# Patient Record
Sex: Male | Born: 1955 | Race: White | Hispanic: No | Marital: Married | Smoking: Former smoker
Health system: Southern US, Community
[De-identification: ages and names within clinical notes are randomized; demographics above are authoritative.]

## PROBLEM LIST (undated history)

## (undated) DIAGNOSIS — R112 Nausea with vomiting, unspecified: Secondary | ICD-10-CM

## (undated) DIAGNOSIS — M23359 Other meniscus derangements, posterior horn of lateral meniscus, unspecified knee: Secondary | ICD-10-CM

## (undated) DIAGNOSIS — K219 Gastro-esophageal reflux disease without esophagitis: Secondary | ICD-10-CM

## (undated) DIAGNOSIS — M1990 Unspecified osteoarthritis, unspecified site: Secondary | ICD-10-CM

## (undated) DIAGNOSIS — E782 Mixed hyperlipidemia: Secondary | ICD-10-CM

## (undated) DIAGNOSIS — R000 Tachycardia, unspecified: Secondary | ICD-10-CM

## (undated) DIAGNOSIS — S83519A Sprain of anterior cruciate ligament of unspecified knee, initial encounter: Secondary | ICD-10-CM

## (undated) DIAGNOSIS — R972 Elevated prostate specific antigen [PSA]: Secondary | ICD-10-CM

## (undated) DIAGNOSIS — E119 Type 2 diabetes mellitus without complications: Secondary | ICD-10-CM

## (undated) DIAGNOSIS — G4730 Sleep apnea, unspecified: Secondary | ICD-10-CM

## (undated) DIAGNOSIS — M23329 Other meniscus derangements, posterior horn of medial meniscus, unspecified knee: Secondary | ICD-10-CM

## (undated) DIAGNOSIS — Z9889 Other specified postprocedural states: Secondary | ICD-10-CM

## (undated) DIAGNOSIS — R079 Chest pain, unspecified: Secondary | ICD-10-CM

## (undated) DIAGNOSIS — I10 Essential (primary) hypertension: Secondary | ICD-10-CM

## (undated) HISTORY — DX: Tachycardia, unspecified: R00.0

## (undated) HISTORY — PX: NASAL SEPTUM SURGERY: SHX37

## (undated) HISTORY — DX: Other meniscus derangements, posterior horn of lateral meniscus, unspecified knee: M23.359

## (undated) HISTORY — PX: KNEE ARTHROSCOPY: SUR90

## (undated) HISTORY — DX: Other meniscus derangements, posterior horn of medial meniscus, unspecified knee: M23.329

## (undated) HISTORY — DX: Chest pain, unspecified: R07.9

## (undated) HISTORY — PX: VASECTOMY: SHX75

## (undated) HISTORY — DX: Unspecified osteoarthritis, unspecified site: M19.90

## (undated) HISTORY — DX: Essential (primary) hypertension: I10

## (undated) HISTORY — DX: Gastro-esophageal reflux disease without esophagitis: K21.9

## (undated) HISTORY — DX: Sprain of anterior cruciate ligament of unspecified knee, initial encounter: S83.519A

## (undated) HISTORY — DX: Mixed hyperlipidemia: E78.2

## (undated) HISTORY — PX: TONSILLECTOMY AND ADENOIDECTOMY: SUR1326

## (undated) HISTORY — DX: Elevated prostate specific antigen (PSA): R97.20

---

## 1999-03-01 ENCOUNTER — Encounter: Payer: Self-pay | Admitting: Emergency Medicine

## 1999-03-01 ENCOUNTER — Emergency Department (HOSPITAL_COMMUNITY): Admission: EM | Admit: 1999-03-01 | Discharge: 1999-03-01 | Payer: Self-pay | Admitting: Emergency Medicine

## 1999-03-05 ENCOUNTER — Emergency Department (HOSPITAL_COMMUNITY): Admission: EM | Admit: 1999-03-05 | Discharge: 1999-03-05 | Payer: Self-pay | Admitting: Emergency Medicine

## 1999-03-06 ENCOUNTER — Encounter: Payer: Self-pay | Admitting: Emergency Medicine

## 2003-04-21 ENCOUNTER — Emergency Department (HOSPITAL_COMMUNITY): Admission: EM | Admit: 2003-04-21 | Discharge: 2003-04-21 | Payer: Self-pay | Admitting: Emergency Medicine

## 2003-04-21 ENCOUNTER — Encounter: Payer: Self-pay | Admitting: Emergency Medicine

## 2006-03-15 ENCOUNTER — Ambulatory Visit: Payer: Self-pay | Admitting: *Deleted

## 2006-03-15 ENCOUNTER — Encounter: Payer: Self-pay | Admitting: Cardiology

## 2008-03-31 ENCOUNTER — Encounter: Admission: RE | Admit: 2008-03-31 | Discharge: 2008-03-31 | Payer: Self-pay | Admitting: Occupational Medicine

## 2008-09-22 ENCOUNTER — Encounter: Payer: Self-pay | Admitting: Orthopedic Surgery

## 2008-09-22 HISTORY — PX: PROSTATE BIOPSY: SHX241

## 2009-01-24 ENCOUNTER — Emergency Department (HOSPITAL_COMMUNITY): Admission: EM | Admit: 2009-01-24 | Discharge: 2009-01-25 | Payer: Self-pay | Admitting: Emergency Medicine

## 2009-01-27 ENCOUNTER — Ambulatory Visit: Payer: Self-pay | Admitting: Orthopedic Surgery

## 2009-01-27 ENCOUNTER — Encounter (INDEPENDENT_AMBULATORY_CARE_PROVIDER_SITE_OTHER): Payer: Self-pay | Admitting: *Deleted

## 2009-01-27 DIAGNOSIS — M23302 Other meniscus derangements, unspecified lateral meniscus, unspecified knee: Secondary | ICD-10-CM | POA: Insufficient documentation

## 2009-01-27 DIAGNOSIS — S83509A Sprain of unspecified cruciate ligament of unspecified knee, initial encounter: Secondary | ICD-10-CM | POA: Insufficient documentation

## 2009-02-01 ENCOUNTER — Telehealth: Payer: Self-pay | Admitting: Orthopedic Surgery

## 2009-02-09 ENCOUNTER — Telehealth: Payer: Self-pay | Admitting: Orthopedic Surgery

## 2009-02-14 ENCOUNTER — Encounter: Payer: Self-pay | Admitting: Orthopedic Surgery

## 2009-02-14 ENCOUNTER — Encounter (INDEPENDENT_AMBULATORY_CARE_PROVIDER_SITE_OTHER): Payer: Self-pay | Admitting: *Deleted

## 2009-02-14 ENCOUNTER — Telehealth: Payer: Self-pay | Admitting: Orthopedic Surgery

## 2009-02-16 ENCOUNTER — Ambulatory Visit (HOSPITAL_COMMUNITY): Admission: RE | Admit: 2009-02-16 | Discharge: 2009-02-16 | Payer: Self-pay | Admitting: Orthopedic Surgery

## 2009-02-16 ENCOUNTER — Telehealth: Payer: Self-pay | Admitting: Orthopedic Surgery

## 2009-02-23 ENCOUNTER — Encounter (INDEPENDENT_AMBULATORY_CARE_PROVIDER_SITE_OTHER): Payer: Self-pay | Admitting: *Deleted

## 2009-02-23 ENCOUNTER — Ambulatory Visit: Payer: Self-pay | Admitting: Orthopedic Surgery

## 2009-02-23 DIAGNOSIS — M23329 Other meniscus derangements, posterior horn of medial meniscus, unspecified knee: Secondary | ICD-10-CM | POA: Insufficient documentation

## 2009-03-01 ENCOUNTER — Ambulatory Visit (HOSPITAL_COMMUNITY): Admission: RE | Admit: 2009-03-01 | Discharge: 2009-03-01 | Payer: Self-pay | Admitting: Orthopedic Surgery

## 2009-03-01 ENCOUNTER — Ambulatory Visit: Payer: Self-pay | Admitting: Orthopedic Surgery

## 2009-03-01 DIAGNOSIS — E785 Hyperlipidemia, unspecified: Secondary | ICD-10-CM

## 2009-03-01 DIAGNOSIS — M1990 Unspecified osteoarthritis, unspecified site: Secondary | ICD-10-CM | POA: Insufficient documentation

## 2009-03-01 DIAGNOSIS — K219 Gastro-esophageal reflux disease without esophagitis: Secondary | ICD-10-CM

## 2009-03-01 DIAGNOSIS — R079 Chest pain, unspecified: Secondary | ICD-10-CM

## 2009-03-01 DIAGNOSIS — I10 Essential (primary) hypertension: Secondary | ICD-10-CM | POA: Insufficient documentation

## 2009-03-01 DIAGNOSIS — R0989 Other specified symptoms and signs involving the circulatory and respiratory systems: Secondary | ICD-10-CM | POA: Insufficient documentation

## 2009-03-03 ENCOUNTER — Ambulatory Visit: Payer: Self-pay | Admitting: Orthopedic Surgery

## 2009-03-04 ENCOUNTER — Encounter: Payer: Self-pay | Admitting: Orthopedic Surgery

## 2009-03-23 ENCOUNTER — Telehealth: Payer: Self-pay | Admitting: Orthopedic Surgery

## 2009-03-31 ENCOUNTER — Ambulatory Visit: Payer: Self-pay | Admitting: Orthopedic Surgery

## 2009-03-31 ENCOUNTER — Encounter (INDEPENDENT_AMBULATORY_CARE_PROVIDER_SITE_OTHER): Payer: Self-pay | Admitting: *Deleted

## 2009-05-06 ENCOUNTER — Encounter: Payer: Self-pay | Admitting: Family Medicine

## 2009-10-19 ENCOUNTER — Emergency Department (HOSPITAL_COMMUNITY): Admission: EM | Admit: 2009-10-19 | Discharge: 2009-10-19 | Payer: Self-pay | Admitting: Emergency Medicine

## 2010-06-01 ENCOUNTER — Emergency Department (HOSPITAL_COMMUNITY)
Admission: EM | Admit: 2010-06-01 | Discharge: 2010-06-01 | Payer: Self-pay | Source: Home / Self Care | Admitting: Family Medicine

## 2010-08-05 ENCOUNTER — Ambulatory Visit: Payer: Self-pay | Admitting: Cardiovascular Disease

## 2010-08-05 ENCOUNTER — Observation Stay (HOSPITAL_COMMUNITY)
Admission: EM | Admit: 2010-08-05 | Discharge: 2010-08-06 | Payer: Self-pay | Source: Home / Self Care | Admitting: Emergency Medicine

## 2010-08-11 ENCOUNTER — Ambulatory Visit (HOSPITAL_COMMUNITY)
Admission: RE | Admit: 2010-08-11 | Discharge: 2010-08-11 | Payer: Self-pay | Source: Home / Self Care | Attending: Cardiology | Admitting: Cardiology

## 2010-08-21 ENCOUNTER — Ambulatory Visit: Payer: Self-pay | Admitting: Internal Medicine

## 2010-08-21 DIAGNOSIS — R1319 Other dysphagia: Secondary | ICD-10-CM | POA: Insufficient documentation

## 2010-08-22 ENCOUNTER — Ambulatory Visit: Payer: Self-pay | Admitting: Cardiology

## 2010-08-29 ENCOUNTER — Ambulatory Visit (HOSPITAL_COMMUNITY)
Admission: RE | Admit: 2010-08-29 | Discharge: 2010-08-29 | Payer: Self-pay | Source: Home / Self Care | Attending: Internal Medicine | Admitting: Internal Medicine

## 2010-09-25 NOTE — Op Note (Addendum)
  [name] [name], CRISANTO                  ACCOUNT NO.:  [id number]  MEDICAL RECORD NO.:  [id number]          PATIENT TYPE:  AMB  LOCATION:  DAY                           FACILITY:  [facility]  PHYSICIAN:  R. Roetta Sessions, M.D. DATE OF BIRTH:  01/12/1956  DATE OF PROCEDURE:  08/29/2010 DATE OF DISCHARGE:                              OPERATIVE REPORT   PROCEDURE:  EGD with Elease Hashimoto dilation.  INDICATIONS FOR PROCEDURE:  A 54-year gentleman with insidiously worsening reflux symptoms and esophageal dysphagia in the setting of significant weight gain over the past 1 year.  We switched him from Nexium once daily to the course of Dexilant 60 mg orally daily.  The patient could not tell much difference in his symptoms.  EGD with possible dilation is appropriate, now being done.  Risks, benefits, limitations, alternatives, and imponderables have been discussed previously and again at the bedside all parties agreeable.  Please see the documentation of medical record.  PROCEDURE NOTE:  O2 saturation, blood pressure, pulse, respirations were monitored throughout the entirety of procedure.  CONSCIOUS SEDATION:  Versed 4 mg IV, Demerol 75 mg IV in divided doses. Cetacaine spray for topical pharyngeal anesthesia.  INSTRUMENT:  Pentax video chip system.  FINDINGS:  Examination of tubular esophagus revealed a couple of tiny erosions straddling GE junction.  There is no Barrett's esophagus or other abnormality.  The tubular esophagus appeared widely patent through the EG junction.  Stomach:  Gastric cavity was emptied and insufflated well with air. Thorough examination of gastric mucosa including retroflexion of proximal stomach, esophagogastric junction demonstrated only a small hiatal hernia.  Pylorus was patent, easily traversed.  Examination of bulb and second portion revealed no abnormalities.  THERAPEUTIC/DIAGNOSTIC MANEUVERS PERFORMED:  Scope was withdrawn.  A 56- French Maloney dilator was  passed to full insertion with ease.  A look back revealed no apparent complication related to passage of the dilator.  The patient tolerated the procedure well.  IMPRESSION: 1. Tiny distal esophageal erosions consistent with mild erosive reflux     esophagitis, otherwise unremarkable esophagus status post passage     of 56-French Maloney dilator. 2. Small hiatal hernia, otherwise normal stomach, D1, and D2.  RECOMMENDATIONS: 1. Increase Nexium 40 mg orally twice daily i.e. before breakfast and     supper. 2. Antireflux literature provided to Mr. [name]. 3. Weight loss emphasized, __________ b.i.d. Nexium regimen for next 3     months, and then can drop back to once daily. 4. Follow up appointment with [place] in 6 months to assess his progress.     Jonathon Bellows, M.D.     RMR/MEDQ  D:  08/29/2010  T:  08/29/2010  Job:  [id number]  cc:   [facility]  Electronically Signed by Lorrin Goodell M.D. on 09/25/2010 09:11:42 AM

## 2010-10-05 NOTE — Assessment & Plan Note (Signed)
Summary: EPH CP POST STRESS ECHO   Visit Type:  Follow-up Primary [name] [name]:  [facility]   History of Present Illness:  [name] is a pleasant 55 y/o obese CM who we are seeing post hospitalization and OP stress test in the setting of chest pain.  During hospitalization Dec 03-04, 2011 he was ruled out for CAD.  F/U OP testing was scheduled.  He is here to discuss the results.  He states that he continues to have occaisonal discomfort in his chest but not severe as it was during his admission to the hospital.  He concern for CAD was related to his younger sister who has had a recent CABG. His other history includes hypertension, dyslipidemia, and tobacco abuse.    Current Medications (verified): 1)  Diovan Hct 320/25 Mg Tabs (Valsartan-Hydrochlorothiazide) .... Take 1 Tablet By Mouth Once A Day 2)  Carvedilol 6.25 Mg Tabs (Carvedilol) .... Take 1 Tablet By Mouth Two Times A Day 3)  Pravastatin Sodium 40 Mg Tabs (Pravastatin Sodium) .... Take 1 Tablet By Mouth Once A Day 4)  Nexium 40 Mg Cpdr (Esomeprazole Magnesium) .... Take 1 Tablet By Mouth Once A Day 5)  Aspir-Low 81 Mg Tbec (Aspirin) .... Take 1 Tablet By Mouth Once A Day  Allergies: No Known Drug Allergies  Comments:  Nurse/Medical Assistant: patient brought med list which is on his phone patients pharmacy is [facility] PMH-FH-SH reviewed-no changes except otherwise noted  Review of Systems       occasional.  All other systems have been reviewed and are negative unless stated above.   Vital Signs:  Patient profile:   55 year old male Weight:      232 pounds BMI:     35.40 Pulse rate:   75 / minute BP sitting:   130 / 81  (right arm)  Vitals Entered By: Dreama Saa, CNA (August 22, 2010 12:50 PM)  Physical Exam  General:  Well developed, well nourished, in no acute distress. Lungs:  Clear bilaterally to auscultation and percussion. Heart:  Non-displaced PMI, chest non-tender; regular rate and  rhythm, S1, S2 without murmurs, rubs or gallops. Carotid upstroke normal, no bruit. Normal abdominal aortic size, no bruits. Femorals normal pulses, no bruits. Pedals normal pulses. No edema, no varicosities. Abdomen:  Bowel sounds positive; abdomen soft and non-tender without masses, organomegaly, or hernias noted. No hepatosplenomegaly. Msk:  Back normal, normal gait. Muscle strength and tone normal. Pulses:  pulses normal in all 4 extremities Psych:  Normal affect.   Impression & Recommendations:  Problem # 1:  CHEST PAIN-UNSPECIFIED (ICD-786.50) Review of stress/echo  dated 08/11/2010 demonstrates "Normal stress echo with no evidence of ischemia."  Reassurance is given.  He states that he is scheduled for EGD next week.  He is advised to come back on as needed basis. His updated medication list for this problem includes:    Aspir-low 81 Mg Tbec (Aspirin) .Marland Kitchen... Take 1 tablet by mouth once a day  Problem # 2:  HYPERTENSION, UNSPECIFIED (ICD-401.9) Well controlled. His updated medication list for this problem includes:    Aspir-low 81 Mg Tbec (Aspirin) .Marland Kitchen... Take 1 tablet by mouth once a day  Patient Instructions: 1)  Your physician recommends that you schedule a follow-up appointment in: as needed  2)  Your physician recommends that you continue on your current medications as directed. Please refer to the Current Medication list given to you today.

## 2010-10-05 NOTE — Letter (Signed)
Summary: EGD ORDER  EGD ORDER   Imported By: Ave Filter 08/21/2010 15:17:52  _____________________________________________________________________  External Attachment:    Type:   Image     Comment:   External Document

## 2010-10-05 NOTE — Assessment & Plan Note (Signed)
Summary: chest pains,acid reflux,gerd/ss   Visit Type:  Initial Visit Primary Care Provider:  [facility]  CC:  chest pains, acid reflux, and gerd.  History of Present Illness: [name] [name] is a 55 year old male with hx of longstanding  reflux, episodes of intermittent nocturnal reflux. Has been on a PPI for at least 10 years. States noticed aggravating symptoms over past few months, including the nocturnal symptoms. Worsening break-through reflux. C/o dysgphia, worsening over past few months. No odynophagia. No epigastric pain. Occasional nausea. Takes Nexium 40 mg, at least for a few years. Has tried Prevacid in past as well. Attempts a healthy diet, reflux not necessarily r/t spicy foods. tries to avoid eating for at least 4 hours prior to sleep. +wt gain since spring, approximately 35 lbs. Has never had an upper endscopy.  Current Medications (verified): 1)  Diovan Hct 320/25 Mg Tabs (Valsartan-Hydrochlorothiazide) .... Take 1 Tablet By Mouth Once A Day 2)  Carvedilol 6.25 Mg Tabs (Carvedilol) .... Take 1 Tablet By Mouth Two Times A Day 3)  Pravastatin Sodium 40 Mg Tabs (Pravastatin Sodium) .... Take 1 Tablet By Mouth Once A Day 4)  Nexium 40 Mg Cpdr (Esomeprazole Magnesium) .... Take 1 Tablet By Mouth Once A Day 5)  Aspir-Low 81 Mg Tbec (Aspirin) .... Take 1 Tablet By Mouth Once A Day  Allergies (verified): No Known Drug Allergies  Past History:  Past Medical History: TACHYCARDIA (ICD-785) ACID REFLUX DISEASE (ICD-530.81) CHEST PAIN-UNSPECIFIED (ICD-786.50) DEGENERATIVE JOINT DISEASE (ICD-715.90) HYPERTENSION, UNSPECIFIED (ICD-401.9) HYPERLIPIDEMIA-MIXED (ICD-272.4) DERANGEMENT OF POSTERIOR HORN OF MEDIAL MENISCUS (ICD-717.2) TEAR A C L (ICD-844.2) DERANGEMENT MENISCUS (ICD-717.5) elevated PSA requiring several biopsies   Past Surgical History: tonsils adenoidectomy deviated septum bilateral knee arthrosscopy  biopsy of prostate 09/22/08 X 3  Family History: Mother:  diabetes, living Father:heart disease, living No FH of Colon Cancer:  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 Eyes:  Denies blurring, irritation, and discharge. ENT:  Complains of difficulty swallowing; denies sore throat and hoarseness. CV:  Denies chest pains, syncope, and dyspnea on exertion. Resp:  Denies dyspnea at rest and wheezing. GI:  Complains of nausea and indigestion/heartburn; denies difficulty swallowing, pain on swallowing, abdominal pain, constipation, change in bowel habits, bloody BM's, and black BMs. GU:  Denies urinary burning, blood in urine, and urinary frequency. MS:  Denies joint pain / LOM, joint swelling, and joint stiffness. Derm:  Denies rash, itching, and dry skin. Neuro:  Denies weakness and syncope. Psych:  Denies depression and anxiety. Endo:  Denies cold intolerance and heat intolerance. Heme:  Denies bruising and bleeding.  Vital Signs:  Patient profile:   55 year old male Height:      68 inches Weight:      237 pounds BMI:     36.17 Temp:     98.8 degrees F oral Pulse rate:   72 / minute BP sitting:   128 / 72  (left arm) Cuff size:   large  Vitals Entered By: Cloria Spring LPN (August 21, 2010 2:22 PM)  Physical Exam  General:  Well developed, well nourished, no acute distress.obese.   Head:  Normocephalic and atraumatic. Eyes:  sclera without icterus Mouth:  No deformity or lesions, dentition normal. Neck:  Supple; no masses or thyromegaly. Lungs:  Clear throughout to auscultation. Heart:  Regular rate and rhythm; no murmurs, rubs,  or bruits.  Abdomen:  normal bowel sounds, obese, without guarding, without rebound, no distesion, no tenderness, no masses, and no hepatomegally or splenomegaly.   Msk:  Symmetrical with no gross deformities. Normal posture. Extremities:  No  clubbing, cyanosis, edema or deformities noted. Neurologic:  Alert and  oriented x4;  grossly normal neurologically. Skin:  Intact without significant lesions or rashes. Psych:  Alert and cooperative. Normal mood and affect.  Impression & Recommendations:  Problem # 1:  GERD (ICD-43.36)  55 year old Caucasian male with long-standing hx of GERD, at least 10 years. No prior upper endoscopy. Hx of Prevacid, switched to Nexium. Has been on Nexium for several years. Has noticed exacerbation of reflux over past few months, including intermittent severe nocturnal reflux. +dysphagia over past few months. Has gained 30+ lbs since spring due to outside stressors. LIkely component of worsening GERD. Due to age, long-standing hx of GERD, needs upper endoscopy to evaluate for any evidence of Barrett's, as well as onset of dysphagia.   EGD/ED with RMR; risks, benefits, alternatives discussed. pt willing to proceed and has given verbal consent. Switch to Dexilant 60 mg daily. #15 samples given. Will send to pharmacy rx Appropriate food choices, weight loss goals discussed.  Orders: Consultation Level III (16109)  Problem # 2:  DYSPHAGIA (UEA-540.98)  See # 1  Orders: Consultation Level III (11914) Prescriptions: DEXILANT 60 MG CPDR (DEXLANSOPRAZOLE) take 1 by mouth daily  #30 x 3   Entered and Authorized by:   Gerrit Halls NP   Signed by:   Gerrit Halls NP on 08/22/2010   Method used:   Faxed to ...       [facility] (retail)       [address]       [place], [place]  [address]       Ph: [phone]       Fax: [phone]   RxID:   [id number]

## 2010-11-14 LAB — BASIC METABOLIC PANEL
BUN: 10 mg/dL (ref 6–23)
GFR calc non Af Amer: >60 mL/min (ref >60)
Glucose, Bld: 109 mg/dL — ABNORMAL HIGH (ref 70–99)
Potassium: 3.8 mEq/L (ref 3.5–5.1)

## 2010-11-14 LAB — DIFFERENTIAL
Basophils Absolute: 0 10*3/uL (ref 0.0–0.1)
Basophils Relative: 1 % (ref 0–1)
Eosinophils Absolute: 0.1 10*3/uL (ref 0.0–0.7)
Eosinophils Relative: 2 % (ref 0–5)
Monocytes Absolute: 0.6 10*3/uL (ref 0.1–1.0)
Neutro Abs: 4.1 10*3/uL (ref 1.7–7.7)

## 2010-11-14 LAB — CBC
HCT: 38.5 % — ABNORMAL LOW (ref 39.0–52.0)
HCT: 40.7 % (ref 39.0–52.0)
MCH: 30.1 pg (ref 26.0–34.0)
MCHC: 35.1 g/dL (ref 30.0–36.0)
MCV: 85.9 fL (ref 78.0–100.0)
Platelets: 180 10*3/uL (ref 150–400)
RDW: 12.3 % (ref 11.5–15.5)
RDW: 12.4 % (ref 11.5–15.5)

## 2010-11-14 LAB — CK TOTAL AND CKMB (NOT AT ARMC)
CK, MB: 1.5 ng/mL (ref 0.3–4.0)
Relative Index: 1.2 (ref 0.0–2.5)
Total CK: 128 U/L (ref 7–232)

## 2010-11-14 LAB — CARDIAC PANEL(CRET KIN+CKTOT+MB+TROPI)
CK, MB: 1.4 ng/mL (ref 0.3–4.0)
Relative Index: 1.2 (ref 0.0–2.5)
Relative Index: INVALID (ref 0.0–2.5)
Troponin I: 0.01 ng/mL (ref 0.00–0.06)

## 2010-11-14 LAB — TROPONIN I: Troponin I: <0.01 ng/mL (ref 0.00–0.06)

## 2010-11-14 LAB — POCT CARDIAC MARKERS

## 2010-11-14 LAB — D-DIMER, QUANTITATIVE: D-Dimer, Quant: 0.23 ug/mL-FEU (ref 0.00–0.48)

## 2010-11-22 LAB — POCT I-STAT, CHEM 8
Calcium, Ion: 1.12 mmol/L (ref 1.12–1.32)
Chloride: 106 mEq/L (ref 96–112)
Creatinine, Ser: 0.9 mg/dL (ref 0.4–1.5)
Glucose, Bld: 90 mg/dL (ref 70–99)
HCT: 36 % — ABNORMAL LOW (ref 39.0–52.0)
Hemoglobin: 12.2 g/dL — ABNORMAL LOW (ref 13.0–17.0)

## 2010-11-22 LAB — POCT CARDIAC MARKERS: Troponin i, poc: <0.05 ng/mL (ref 0.00–0.09)

## 2010-12-11 LAB — BASIC METABOLIC PANEL
BUN: 15 mg/dL (ref 6–23)
Chloride: 101 mEq/L (ref 96–112)
GFR calc non Af Amer: >60 mL/min (ref >60)
Potassium: 4.1 mEq/L (ref 3.5–5.1)
Sodium: 138 mEq/L (ref 135–145)

## 2010-12-11 LAB — HEMOGLOBIN AND HEMATOCRIT, BLOOD: Hemoglobin: 14.1 g/dL (ref 13.0–17.0)

## 2011-01-16 NOTE — Op Note (Signed)
NAMEKIZER, NOBBE NO.:  [id number]   MEDICAL RECORD NO.:  [id number]          PATIENT TYPE:  AMB   LOCATION:  DAY                           FACILITY:  [facility]   PHYSICIAN:  Vickki Hearing, M.D.DATE OF BIRTH:  [birth year]   DATE OF PROCEDURE:  03/01/2009  DATE OF DISCHARGE:  03/01/2009                               OPERATIVE REPORT   CHIEF COMPLAINT:  Right knee pain.   HISTORY:  This 55 years old, he was injured on Jan 24, 2009.  He stepped  off a step.  His knee popped.  He felt acute pain.  He eventually went  to the emergency room, where x-ray showed joint effusion, no fracture.  He was placed in a long-leg brace.  He was followed up in our office and  he was found to have signs of meniscal tear.  He was sent for MRI.  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   PREOPERATIVE DIAGNOSIS:  Torn medial meniscus, right knee.   POSTOPERATIVE DIAGNOSIS:  Torn medial meniscus, right knee.   PROCEDURE:  Arthroscopy right knee, partial medial meniscectomy.   SURGEON:  Vickki Hearing, MD   ASSISTANTS:  There were no assistants.   ANESTHESIA:  General anesthetic.   FINDINGS:  Tear of the posterior horn of the medial meniscus.  Remaining  portion of the joint were completely normal.  There was no  chondromalacia in the trochlea.   BLOOD LOSS:  Minimal.   COMPLICATIONS:  None.   COUNTS:  Reported as correct.   DETAILS OF PROCEDURE:  The patient was identified in the preop holding  area.  Site marking was performed, countersigned by the surgeon.  The  patient was taken to surgery after history and physical update and he  was given general anesthesia by LMA.  He had his right knee prepped with  chlorhexidine.  It was placed in a well leg holder prior to that.   Left leg was placed on a padded bolster.   After  time-out was completed, diagnostic arthroscopy was performed.  Medial portal was established for working.  Medial meniscus was probed,  found to be torn.  It was resected with the duckbill forceps and a  motorized shaver was used to balance the meniscus.  Probe was reinserted  in the knee and the meniscus was stable with a stable rim.  The  remaining portions of the knee joint were normal.  The knee was  irrigated and closed with Steri-Strips, injected with additional 30 mL  of Marcaine with epinephrine.  Cryo cuff was applied with sterile  dressing.   The patient was extubated and taken to recovery room in stable  condition.   Postop plan is for full weightbearing.  Cryo cuff for 48 hours.   Discharge medications will include:  1. Norco 7.5/325 one q.4 p.r.n. for pain #91 refill.  2. Phenergan 25 mg 1 every 4 hours p.r.n. for  nausea #40.  Therapy on      Friday.  Followup on Thursday.      Vickki Hearing, M.D.  Electronically Signed     SEH/MEDQ  D:  03/01/2009  T:  03/02/2009  Job:  [id number]

## 2011-01-19 NOTE — Assessment & Plan Note (Signed)
[facility]                              CARDIOLOGY OFFICE NOTE   AKBAR, SACRA                         MRN:          [id number]  DATE:03/15/2006                            DOB:          [birth year]      Mr. Cogswell performed a standard treadmill test with good result.  He  achieved a work load of 11 METS and a heart rate of 164, 95% of age-  predicted maximum.  He did not develop the chest tightness that he has  described in the past, but stopped with fairly prominent dyspnea and  fatigue.  EKG was normal at rest.  There were no significant changes with  exertion.   With an adequate negative stress test, I am not inclined to pursue  additional evaluation for ischemic heart disease at this time.  It may well  be that his problems are more pulmonary than cardiac.  I strongly  recommended that he once again pursue smoking cessation and that his wife  attempt to do so at the same time.  He will increase Niacin to 500 mg t.i.d.  for better control of his dyslipidemia.  I will plan to reassess this nice  gentleman in 3 months.  Thanks so much for sending him to me.                             [name]. Dietrich Pates, MD, [facility]    RMR/MedQ  DD:  03/15/2006  DT:  03/15/2006  Job #:  [id number]   cc:   Samuel Jester

## 2011-01-19 NOTE — Letter (Signed)
March 15, 2006     [address]  [place], [place] [address]   RE:  [name], [name]  MRN:  [id number]  /  DOB:  09/03/1956   Dear Aram Beecham,   It is my pleasure evaluating [name] [name] in the office today in consultation  at your request.  As you know, this very nice 55 year old gentleman has  recently reported exertional chest discomfort.  With moderate to marked  effort, he develops dyspnea, diaphoresis, and a vague upper substernal  pressure without radiation.  The symptoms are relieved with approximately 5-  10 minutes of rest.  The threshold is relatively reproducible.  He has had a  questionable diagnosis of asthma in the past and sometimes uses an albuterol  inhaler.   He has multiple cardiovascular risk factors, including cigarette smoking.  Total consumption is approximately 40 pack years.  He has quit on a few  occasions for intervals as long as seven months but works in [employer] and has multiple family members who smoke, prompting relapse.  He  has hypertension that has been well treated.  He has dyslipidemia with very  low HDL levels.   Past medical history is otherwise relatively benign.  He has had  arthroscopic knee surgery and repair of a deviated septum.  He required  colonoscopy for lower GI bleed, which did not yield a specific diagnosis.  He has no known allergies.   CURRENT MEDICATIONS:  1.  Fish oil 2 gm daily.  2.  Niacin 500 mg daily.  3.  Nexium 40 mg daily.  4.  Diovan/HCT 160/12.5 mg daily.  5.  Carvedilol 6.25 mg b.i.d.  6.  Pravastatin 40 mg daily.   SOCIAL HISTORY:  Works in a [employer], political party, as noted above;  relatively sedentary lifestyle but does some fishing and golfing. Married  with two children.   FAMILY HISTORY:  Father had CABG surgery at a young age; both of his parents  are living as well as five siblings.   REVIEW OF SYSTEMS:  Notable for the need for corrective lenses, GERD  symptoms, which are mild; he  has continuing arthritic discomfort in both  knees.  He occasionally notes mild edema of the lower extremities.   PHYSICAL EXAMINATION:  VITAL SIGNS:  Weight is 201.  Blood pressure 125/80,  heart rate 80 and regular, respirations 16.  GENERAL:  Pleasant gentleman in no acute distress.  HEENT:  Grade I hypertensive changes on funduscopic exam; pupils are equal,  round and reactive to light; normal oral mucosa; suboptimal dentition.  NECK:  No jugular venous distention; normal carotid upstrokes without  bruits.  ENDOCRINE:  No thyromegaly.  HEMATOPOIETIC:  No adenopathy.  SKIN:  Healing burn over the left upper arm with surrounding erythema.  LUNGS:  Clear.  CARDIAC:  Normal first and second heart sounds; fourth heart sound present.  ABDOMEN:  Soft and nontender; no masses; no organomegaly.  EXTREMITIES:  Distal pulses intact; no edema.  NEUROMUSCULAR:  Symmetric strength and tone; normal cranial nerves.  PSYCHIATRIC:  Alert and oriented; normal affect.   Your office staff was kind enough to provide laboratory results, including a  normal chemistry profile and a recent lipid profile.  Total cholesterol was  138, HDL 25, triglycerides 100, and LDL 93.   IMPRESSION:  [name] [name] does have significant cardiovascular risk with a  positive family history, hypertension, and dyslipidemia.  Counterbalancing  this are a normal physical examination at a  relatively young age as well as  a normal resting EKG.  His symptoms are quite worrisome for angina but could  also represent dyspnea related to physical deconditioning, cigarette  smoking, or some component of bronchospasm.  We will proceed with a graded  exercise test to further assess the likelihood of ischemic heart disease.  His lipid-lowering therapy is quite good.  You may consider titrating niacin  to a total of at least 1500 mg a day.  Blood pressure control is good.  Carvedilol is a relatively expensive drug to use as an   antihypertensive.  You might consider changing to metoprolol or Toprol.  Based upon the results of his stress test, further assessment and/or  treatment may be warranted.  I will let you know the results of that study.  Thanks so much for sending this nice gentleman to me.    Sincerely,      [name]. Dietrich Pates, MD, [facility]   RMR/MedQ  DD:  03/15/2006  DT:  03/15/2006  Job #:  [id number]

## 2011-01-19 NOTE — Cardiovascular Report (Signed)
[facility]                                  ECHOCARDIOGRAM   [name] [name], [name] [name]                         MRN:          [id number]  DATE:03/15/2006                            DOB:          [birth year]      REFERRING PHYSICIAN:  Dr. Samuel Jester.   CLINICAL DATA:  A 55 year old gentleman with exertional chest tightness  accompanied by dyspnea and diaphoresis.  Multiple cardiovascular risk  factors.   1.  Treadmill exercise performed to a work load of 11 mets  and a heart rate      164, 95% of the patient's age - predicted maximum.  Exercise      discontinued due to dyspnea and fatigue; no chest discomfort reported.  2.  Blood pressure increased from a resting value of 125/85 to 160/95 during      exercise and 170/95 early in recovery, a normal response.  3.  No arrhythmia is noted.  4.  Baseline EKG:  Normal sinus rhythm; within normal limits.   Stress EKG:  Insignificant rapidly upsloping ST segment depression.   IMPRESSION:  Negative and adequate graded exercise test revealing adequate  exercise capacity, no reproduction of the patient's anginal chest  discomfort, a normal blood pressure response, and a normal  electrocardiographic response.                                   [name]. Dietrich Pates, MD, [facility]   RMR/MedQ  DD:  03/15/2006  DT:  03/15/2006  Job #:  [id number]   cc:   Samuel Jester

## 2011-02-09 ENCOUNTER — Encounter: Payer: Self-pay | Admitting: Internal Medicine

## 2011-04-06 ENCOUNTER — Emergency Department (HOSPITAL_COMMUNITY): Payer: 59

## 2011-04-06 ENCOUNTER — Emergency Department (HOSPITAL_COMMUNITY)
Admission: EM | Admit: 2011-04-06 | Discharge: 2011-04-07 | Disposition: A | Discharge status: Home / Self Care | Payer: 59 | Attending: Emergency Medicine | Admitting: Emergency Medicine

## 2011-04-06 DIAGNOSIS — R109 Unspecified abdominal pain: Secondary | ICD-10-CM | POA: Insufficient documentation

## 2011-04-06 DIAGNOSIS — R112 Nausea with vomiting, unspecified: Secondary | ICD-10-CM | POA: Insufficient documentation

## 2011-04-06 DIAGNOSIS — N419 Inflammatory disease of prostate, unspecified: Secondary | ICD-10-CM | POA: Insufficient documentation

## 2011-04-06 DIAGNOSIS — E7800 Pure hypercholesterolemia, unspecified: Secondary | ICD-10-CM | POA: Insufficient documentation

## 2011-04-06 DIAGNOSIS — K219 Gastro-esophageal reflux disease without esophagitis: Secondary | ICD-10-CM | POA: Insufficient documentation

## 2011-04-06 DIAGNOSIS — N453 Epididymo-orchitis: Secondary | ICD-10-CM | POA: Insufficient documentation

## 2011-04-06 DIAGNOSIS — Z87442 Personal history of urinary calculi: Secondary | ICD-10-CM | POA: Insufficient documentation

## 2011-04-06 DIAGNOSIS — I499 Cardiac arrhythmia, unspecified: Secondary | ICD-10-CM | POA: Insufficient documentation

## 2011-04-06 DIAGNOSIS — N509 Disorder of male genital organs, unspecified: Secondary | ICD-10-CM | POA: Insufficient documentation

## 2011-04-06 DIAGNOSIS — I10 Essential (primary) hypertension: Secondary | ICD-10-CM | POA: Insufficient documentation

## 2011-04-06 LAB — BASIC METABOLIC PANEL
BUN: 19 mg/dL (ref 6–23)
CO2: 25 mEq/L (ref 19–32)
Chloride: 99 mEq/L (ref 96–112)
Creatinine, Ser: 0.67 mg/dL (ref 0.50–1.35)
Glucose, Bld: 104 mg/dL — ABNORMAL HIGH (ref 70–99)

## 2011-04-06 LAB — URINALYSIS, ROUTINE W REFLEX MICROSCOPIC
Bilirubin Urine: NEGATIVE
Glucose, UA: NEGATIVE mg/dL
Leukocytes, UA: NEGATIVE
pH: 5.5 (ref 5.0–8.0)

## 2011-04-06 LAB — CBC
HCT: 41.1 % (ref 39.0–52.0)
MCV: 83.4 fL (ref 78.0–100.0)
RBC: 4.93 MIL/uL (ref 4.22–5.81)
WBC: 10.9 10*3/uL — ABNORMAL HIGH (ref 4.0–10.5)

## 2011-04-06 LAB — DIFFERENTIAL
Eosinophils Relative: 1 % (ref 0–5)
Lymphocytes Relative: 15 % (ref 12–46)
Lymphs Abs: 1.6 10*3/uL (ref 0.7–4.0)
Neutrophils Relative %: 76 % (ref 43–77)

## 2011-04-07 ENCOUNTER — Emergency Department (HOSPITAL_COMMUNITY): Payer: 59

## 2012-07-30 DIAGNOSIS — R972 Elevated prostate specific antigen [PSA]: Secondary | ICD-10-CM | POA: Insufficient documentation

## 2012-12-12 DIAGNOSIS — N51 Disorders of male genital organs in diseases classified elsewhere: Secondary | ICD-10-CM | POA: Insufficient documentation

## 2013-04-23 ENCOUNTER — Emergency Department: Payer: Self-pay | Admitting: Internal Medicine

## 2013-04-23 LAB — URINALYSIS, COMPLETE
Bacteria: NONE SEEN
Bilirubin,UR: NEGATIVE
Glucose,UR: NEGATIVE mg/dL (ref 0–75)
Ketone: NEGATIVE
Leukocyte Esterase: NEGATIVE
Nitrite: NEGATIVE
RBC,UR: NONE SEEN /HPF (ref 0–5)
Specific Gravity: 1.005 (ref 1.003–1.030)
Squamous Epithelial: NONE SEEN
WBC UR: <1 /HPF (ref 0–5)

## 2013-04-24 LAB — URINE CULTURE

## 2013-05-14 ENCOUNTER — Other Ambulatory Visit (HOSPITAL_COMMUNITY)
Admission: RE | Admit: 2013-05-14 | Discharge: 2013-05-14 | Disposition: A | Discharge status: Home / Self Care | Payer: 59 | Source: Ambulatory Visit | Attending: Urology | Admitting: Urology

## 2013-05-14 DIAGNOSIS — Z9852 Vasectomy status: Secondary | ICD-10-CM | POA: Insufficient documentation

## 2013-05-30 ENCOUNTER — Encounter (HOSPITAL_COMMUNITY): Payer: Self-pay | Admitting: Emergency Medicine

## 2013-05-30 ENCOUNTER — Emergency Department (HOSPITAL_COMMUNITY)
Admission: EM | Admit: 2013-05-30 | Discharge: 2013-05-30 | Disposition: A | Discharge status: Home / Self Care | Payer: 59 | Attending: Emergency Medicine | Admitting: Emergency Medicine

## 2013-05-30 DIAGNOSIS — Z87828 Personal history of other (healed) physical injury and trauma: Secondary | ICD-10-CM | POA: Insufficient documentation

## 2013-05-30 DIAGNOSIS — M1990 Unspecified osteoarthritis, unspecified site: Secondary | ICD-10-CM | POA: Insufficient documentation

## 2013-05-30 DIAGNOSIS — Z7982 Long term (current) use of aspirin: Secondary | ICD-10-CM | POA: Insufficient documentation

## 2013-05-30 DIAGNOSIS — E782 Mixed hyperlipidemia: Secondary | ICD-10-CM | POA: Insufficient documentation

## 2013-05-30 DIAGNOSIS — R112 Nausea with vomiting, unspecified: Secondary | ICD-10-CM | POA: Insufficient documentation

## 2013-05-30 DIAGNOSIS — N200 Calculus of kidney: Secondary | ICD-10-CM

## 2013-05-30 DIAGNOSIS — Z87891 Personal history of nicotine dependence: Secondary | ICD-10-CM | POA: Insufficient documentation

## 2013-05-30 DIAGNOSIS — I10 Essential (primary) hypertension: Secondary | ICD-10-CM | POA: Insufficient documentation

## 2013-05-30 DIAGNOSIS — K219 Gastro-esophageal reflux disease without esophagitis: Secondary | ICD-10-CM | POA: Insufficient documentation

## 2013-05-30 DIAGNOSIS — Z79899 Other long term (current) drug therapy: Secondary | ICD-10-CM | POA: Insufficient documentation

## 2013-05-30 LAB — CBC WITH DIFFERENTIAL/PLATELET
Basophils Relative: 0 % (ref 0–1)
HCT: 40 % (ref 39.0–52.0)
Hemoglobin: 14.3 g/dL (ref 13.0–17.0)
MCH: 29.6 pg (ref 26.0–34.0)
MCHC: 35.8 g/dL (ref 30.0–36.0)
Monocytes Absolute: 0.8 10*3/uL (ref 0.1–1.0)
Monocytes Relative: 8 % (ref 3–12)
Neutro Abs: 6.3 10*3/uL (ref 1.7–7.7)

## 2013-05-30 LAB — URINALYSIS, ROUTINE W REFLEX MICROSCOPIC
Bilirubin Urine: NEGATIVE
Glucose, UA: NEGATIVE mg/dL
Hgb urine dipstick: NEGATIVE
Ketones, ur: NEGATIVE mg/dL
Nitrite: NEGATIVE
pH: 6 (ref 5.0–8.0)

## 2013-05-30 LAB — BASIC METABOLIC PANEL
BUN: 17 mg/dL (ref 6–23)
Chloride: 93 mEq/L — ABNORMAL LOW (ref 96–112)
Creatinine, Ser: 0.7 mg/dL (ref 0.50–1.35)
GFR calc Af Amer: >90 mL/min (ref >90)
Glucose, Bld: 141 mg/dL — ABNORMAL HIGH (ref 70–99)

## 2013-05-30 MED ORDER — MORPHINE SULFATE 4 MG/ML IJ SOLN
4.0000 mg | Freq: Once | INTRAMUSCULAR | Status: AC
Start: 2013-05-30 — End: 2013-05-30
  Administered 2013-05-30: 4 mg via INTRAVENOUS
  Filled 2013-05-30: qty 1

## 2013-05-30 MED ORDER — SODIUM CHLORIDE 0.9 % IV BOLUS (SEPSIS)
1000.0000 mL | Freq: Once | INTRAVENOUS | Status: AC
  Administered 2013-05-30: 1000 mL via INTRAVENOUS

## 2013-05-30 MED ORDER — ONDANSETRON HCL 4 MG/2ML IJ SOLN
4.0000 mg | Freq: Once | INTRAMUSCULAR | Status: AC
  Administered 2013-05-30: 4 mg via INTRAVENOUS
  Filled 2013-05-30: qty 2

## 2013-05-30 MED ORDER — KETOROLAC TROMETHAMINE 30 MG/ML IJ SOLN
30.0000 mg | Freq: Once | INTRAMUSCULAR | Status: AC
  Administered 2013-05-30: 30 mg via INTRAVENOUS
  Filled 2013-05-30: qty 1

## 2013-05-30 MED ORDER — HYDROCODONE-ACETAMINOPHEN 5-325 MG PO TABS: 1.0000 | ORAL_TABLET | ORAL | Status: DC | PRN

## 2013-05-30 NOTE — ED Provider Notes (Signed)
CSN: [id number]     Arrival date & time 05/30/13  1732 History   First MD Initiated Contact with Patient 05/30/13 1736     Chief Complaint  Patient presents with  . Flank Pain   (Consider location/radiation/quality/duration/timing/severity/associated sxs/prior Treatment) HPI This is a 57 year old male with history of hypertension, kidney stones, and recent vasectomy who presents with right-sided flank pain. He reports onset of pain yesterday. He rates his pain as 8/10.  Patient states that radiates from the right flank into his right groin. He endorses nonbilious, nonbloody emesis and nausea. He states this feels like his kidney stones. Of note, the patient did have a vasectomy 2 weeks ago. He was seen by his urologist on Wednesday who examined him and gave him a clean bill of health. Patient does endorse dysuria without frequency or urgency. He denies any fevers, headache, chest pain, shortness of breath, focal weakness or numbness. Past Medical History  Diagnosis Date  . Tachycardia   . Acid reflux disease   . Chest pain, unspecified   . DJD (degenerative joint disease)   . Hypertension   . Hyperlipidemia, mixed   . Derangement of posterior horn of medial meniscus   . Tear of anterior cruciate ligament of knee   . Derangement of posterior horn of lateral meniscus   . Elevated PSA     requiring several bx   Past Surgical History  Procedure Laterality Date  . Tonsillectomy and adenoidectomy    . Nasal septum surgery    . Knee arthroscopy      Bilateral  . Prostate biopsy  09/22/08     x 3   History reviewed. No pertinent family history. History  Substance Use Topics  . Smoking status: Former Games developer  . Smokeless tobacco: Not on file  . Alcohol Use: No    Review of Systems  Constitutional: Negative.  Negative for fever.  Respiratory: Negative.  Negative for chest tightness and shortness of breath.   Cardiovascular: Negative.  Negative for chest pain.  Gastrointestinal:  Positive for nausea and vomiting. Negative for abdominal pain.  Genitourinary: Positive for dysuria and flank pain.  Musculoskeletal: Negative for back pain.  Skin: Negative for rash.  Neurological: Negative for headaches.  All other systems reviewed and are negative.    Allergies  Review of patient's allergies indicates no known allergies.  Home Medications   Current Outpatient Rx  Name  Route  Sig  Dispense  Refill  . aspirin 81 MG tablet   Oral   Take 81 mg by mouth daily.           . carvedilol (COREG) 6.25 MG tablet   Oral   Take 6.25 mg by mouth 2 (two) times daily with a meal.           . esomeprazole (NEXIUM) 40 MG capsule   Oral   Take 40 mg by mouth daily before breakfast.           . HYDROcodone-acetaminophen (NORCO/VICODIN) 5-325 MG per tablet   Oral   Take 1 tablet by mouth every 4 (four) hours as needed for pain.   10 tablet   0   . pravastatin (PRAVACHOL) 40 MG tablet   Oral   Take 40 mg by mouth daily.           . valsartan-hydrochlorothiazide (DIOVAN-HCT) 320-25 MG per tablet   Oral   Take 1 tablet by mouth daily.  BP 139/92  Pulse 74  Temp(Src) 97.8 F (36.6 C) (Oral)  Resp 22  SpO2 100% Physical Exam  Nursing note and vitals reviewed. Constitutional: He is oriented to person, place, and time. He appears well-developed and well-nourished. He appears distressed.  Appears uncomfortable  HENT:  Head: Normocephalic and atraumatic.  Neck: Neck supple.  Cardiovascular: Normal rate, regular rhythm and normal heart sounds.   No murmur heard. Pulmonary/Chest: Effort normal and breath sounds normal. No respiratory distress. He has no wheezes.  Abdominal: Soft. Bowel sounds are normal. There is no tenderness. There is no rebound.  No CVA tenderness noted  Genitourinary:  Normal scrotum and penis, no ecchymosis or abnormality noted  Musculoskeletal: He exhibits no edema.  Lymphadenopathy:    He has no cervical adenopathy.   Neurological: He is alert and oriented to person, place, and time.  Skin: Skin is warm and dry.  Psychiatric: He has a normal mood and affect.    ED Course  Procedures (including critical care time) Labs Review Labs Reviewed  BASIC METABOLIC PANEL - Abnormal; Notable for the following:    Sodium 132 (*)    Chloride 93 (*)    Glucose, Bld 141 (*)    All other components within normal limits  GLUCOSE, CAPILLARY - Abnormal; Notable for the following:    Glucose-Capillary 137 (*)    All other components within normal limits  CBC WITH DIFFERENTIAL  URINALYSIS, ROUTINE W REFLEX MICROSCOPIC   Imaging Review No results found.  MDM   1. Kidney stone     This is a 57 year old male who presents with right-sided flank pain. Patient is uncomfortable appearing on exam but nontoxic. Vital signs are reassuring. Physical exam is benign.  Patient has a history of kidney stones in his presentation seems consistent with this. Basic labwork was obtained. Lab work is notable for sodium of 132 and a chloride of 93 most likely secondary to the patient's recurrent emesis. Patient was given a normal saline bolus, Toradol, and morphine. Patient had improvement of his pain.  I discussed the patient's lab results with him. Given that he has had improved pain and his labs are reassuring, patient has decided to defer further testing or CT scan at this time given his known history of kidney stones. I feel this is appropriate to give him a trial of passage.  Patient will be given a short course of narcotic pain medication. He is to followup with his primary care for either on Monday. If he has worsening of pain, fevers, or any worsening of symptoms he is to return.  After history, exam, and medical workup I feel the patient has been appropriately medically screened and is safe for discharge home. Pertinent diagnoses were discussed with the patient. Patient was given return precautions.   Shon Baton,  MD 05/30/13 [phone]

## 2013-05-30 NOTE — ED Notes (Signed)
Pt c/o right side flank pain started yesterday. Hx of kidney stones. States pain has now radiated down to right groin area. N/v x 7 in last couple hours. Urinated last x 2 hrs ago. Grimacing and active vomiting noted.

## 2013-07-09 ENCOUNTER — Other Ambulatory Visit: Payer: Self-pay

## 2013-08-05 ENCOUNTER — Other Ambulatory Visit: Payer: Self-pay

## 2013-08-05 ENCOUNTER — Encounter (HOSPITAL_COMMUNITY): Payer: Self-pay

## 2013-08-05 ENCOUNTER — Encounter (HOSPITAL_COMMUNITY): Payer: Self-pay | Admitting: Pharmacy Technician

## 2013-08-05 ENCOUNTER — Encounter (HOSPITAL_COMMUNITY)
Admission: RE | Admit: 2013-08-05 | Discharge: 2013-08-05 | Disposition: A | Discharge status: Home / Self Care | Payer: 59 | Source: Ambulatory Visit | Attending: Urology | Admitting: Urology

## 2013-08-05 DIAGNOSIS — Z01818 Encounter for other preprocedural examination: Secondary | ICD-10-CM | POA: Insufficient documentation

## 2013-08-05 DIAGNOSIS — Z01812 Encounter for preprocedural laboratory examination: Secondary | ICD-10-CM | POA: Insufficient documentation

## 2013-08-05 HISTORY — DX: Other specified postprocedural states: R11.2

## 2013-08-05 HISTORY — DX: Other specified postprocedural states: Z98.890

## 2013-08-05 HISTORY — DX: Sleep apnea, unspecified: G47.30

## 2013-08-05 HISTORY — DX: Type 2 diabetes mellitus without complications: E11.9

## 2013-08-05 LAB — CBC
HCT: 40.9 % (ref 39.0–52.0)
Hemoglobin: 14.3 g/dL (ref 13.0–17.0)
MCH: 29.2 pg (ref 26.0–34.0)
MCHC: 35 g/dL (ref 30.0–36.0)
RBC: 4.89 MIL/uL (ref 4.22–5.81)

## 2013-08-05 LAB — BASIC METABOLIC PANEL
BUN: 12 mg/dL (ref 6–23)
CO2: 29 mEq/L (ref 19–32)
Chloride: 98 mEq/L (ref 96–112)
Glucose, Bld: 106 mg/dL — ABNORMAL HIGH (ref 70–99)
Potassium: 4.2 mEq/L (ref 3.5–5.1)

## 2013-08-05 NOTE — Patient Instructions (Signed)
[name] [name]  08/05/2013   Your procedure is scheduled on:  Tuesday, 08/11/13  Report to [facility] [place] AM.  Call this number if you have problems the morning of surgery: [phone]   Remember:   Do not eat food or drink liquids after midnight.   Take these medicines the morning of surgery with A SIP OF WATER: diovan, nexium, coreg, norco if needed   Do not wear jewelry, make-up or nail polish.  Do not wear lotions, powders, or perfumes. You may wear deodorant.  Do not shave 48 hours prior to surgery. Men may shave face and neck.  Do not bring valuables to the hospital.  [facility] is not responsible                  for any belongings or valuables.               Contacts, dentures or bridgework may not be worn into surgery.  Leave suitcase in the car. After surgery it may be brought to your room.  For patients admitted to the hospital, discharge time is determined by your                treatment team.               Patients discharged the day of surgery will not be allowed to drive  home.  Name and phone number of your driver: family  Special Instructions: Shower using CHG 2 nights before surgery and the night before surgery.  If you shower the day of surgery use CHG.  Use special wash - you have one bottle of CHG for all showers.  You should use approximately 1/3 of the bottle for each shower.   Please read over the following fact sheets that you were given: Coughing and Deep Breathing, Surgical Site Infection Prevention, Anesthesia Post-op Instructions and Care and Recovery After Surgery   Transurethral Resection of the Prostate Care After Refer to this sheet in the next few weeks. These instructions provide you with information on caring for yourself after your procedure. Your caregiver also may give you specific instructions. Your treatment has been planned according to current medical practices, but complications sometimes occur. Call your caregiver if you have any problems  or questions after your procedure. HOME CARE INSTRUCTIONS  Recovery can take 4 6 weeks. Avoid alcohol, caffeinated drinks, and spicy foods for 2 weeks after your procedure. Drink enough fluids to keep your urine clear or pale yellow. Urinate as soon as you feel the urge to do so. Do not try to hold your urine for long periods of time. During recovery you may experience pain caused by bladder spasms, which result in a very intense urge to urinate. Take all medicines as directed by your caregiver, including medicines for pain. Try to limit the amount of pain medicines you take because it can cause constipation. If you do become constipated, do not strain to move your bowels. Straining can increase bleeding. Constipation can be minimized by increasing the amount fluids and fiber in your diet. Your caregiver also may prescribe a stool softener. Do not lift heavy objects (more than 5 lb [2.25 kg]) or perform exercises that cause you to strain for at least 1 month after your procedure. When sitting, you may want to sit in a soft chair or use a cushion. For the first 10 days after your procedure, avoid the following activities:  Running.  Strenuous work.  Long walks.  Riding in a car for extended periods.  Sex. SEEK MEDICAL CARE IF:  You have difficulty urinating.  You have blood in your urine that does not go away after you rest or increase your fluid intake.  You have swelling in your penis or scrotum. SEEK IMMEDIATE MEDICAL CARE IF:   You are suddenly unable to urinate.  You notice blood clots in your urine.  You have chills.  You have a fever.  You have pain in your back or lower abdomen.  You have pain or swelling in your legs. MAKE SURE YOU:   Understand these instructions.  Will watch your condition.  Will get help right away if you are not doing well or get worse. Document Released: 08/20/2005 Document Revised: 05/14/2012 Document Reviewed: 09/28/2011 [facility] Patient  Information 2014 [place], [place]. Transurethral Resection of the Prostate Transurethral resection of the prostate (TURP) is the removal of part of your prostate to treat noncancerous (benign) prostatic hyperplasia (BPH). BPH typically occurs in men older than 40 years. It is the abnormal growth of cells in your prostate. Specifically, it is an abnormal increase in the number of cells that make up your prostate tissue. This causes an increase in the size of your prostate. Often, in the case of BPH, the prostate becomes so large that it compresses the tube that drains urine out of your body from your bladder (urethra). Eventually, this compression can obstruct the flow of urine from your bladder. This obstruction can cause recurrent bladder infection and difficulties with bladder control and bladder emptying. The goal of TURP is to remove enough prostate tissue to allow for an unobstructed flow of urine, which often resolves the associated conditions. LET YOUR CAREGIVER KNOW ABOUT:  Any allergies you have.  Any medicines you are taking, including herbs, eye drops, over-the-counter medicines, and creams.  Any problems you have had with the use of anesthetics.  Any blood disorders you have, including bleeding problems and clotting problems.  Previous surgeries you have had.  Any prostate infections you have had. RISKS AND COMPLICATIONS Generally, TURP is a safe procedure. However, as with any surgical procedure, complications can occur. Possible complications associated with TURP include:  Difficulty getting an erection.  Scarring, which may cause problems with the flow of your urine.  Injury to your urethra.  Incontinence from injury to the muscle that surrounds your prostate, which controls urine flow.  Infection.  Bleeding.  Injury to your bladder (rare). BEFORE THE PROCEDURE  Your caregiver will tell you when you need to stop eating and drinking. If you take any medicines, your  caregiver will tell you which ones you may keep taking and which ones you will have to stop taking and when.  Just before the procedure you will also receive medicine to make you fall asleep (general anesthetic). This will be given through a tube that is inserted into one of your veins (intravenous [IV] tube). PROCEDURE Your surgeon inserts an instrument that is similar to a telescope with an electric cutting edge (resectoscope) through your urethra to the area of the prostate gland. The cutting edge is used to remove enlarged pieces of your prostate, one piece at a time. At the end of your procedure, a flexible tube (catheter) will be inserted into your urethra to drain your bladder. Special plastic bags filled with solution will be connected to the end of the catheter. The solution will be used to irrigate blood from your bladder while you heal.  AFTER THE PROCEDURE You  will be taken to the recovery area. Once you are awake, stable, and taking fluids well, you will be taken to your hospital room. Typically, you will stay in the hospital 1 2 days after this procedure. The catheter usually is removed before discharge from the hospital. Document Released: 08/20/2005 Document Revised: 05/14/2012 Document Reviewed: 01/21/2012 [facility] Patient Information 2014 [place], [place].

## 2013-08-05 NOTE — Progress Notes (Signed)
08/05/13 1247  OBSTRUCTIVE SLEEP APNEA  Have you ever been diagnosed with sleep apnea through a sleep study? No  Do you snore loudly (loud enough to be heard through closed doors)?  1  Do you often feel tired, fatigued, or sleepy during the daytime? 0  Has anyone observed you stop breathing during your sleep? 0  Do you have, or are you being treated for high blood pressure? 1  BMI more than 35 kg/m2? 1  Age over 55 years old? 1  Neck circumference greater than 40 cm/18 inches? 0  Gender: 1  Obstructive Sleep Apnea Score 5  Score 4 or greater  Results sent to PCP

## 2013-08-11 ENCOUNTER — Encounter (HOSPITAL_COMMUNITY): Payer: Self-pay | Admitting: *Deleted

## 2013-08-11 ENCOUNTER — Encounter (HOSPITAL_COMMUNITY): Payer: 59 | Admitting: Anesthesiology

## 2013-08-11 ENCOUNTER — Observation Stay (HOSPITAL_COMMUNITY)
Admission: RE | Admit: 2013-08-11 | Discharge: 2013-08-12 | Disposition: A | Discharge status: Home / Self Care | Payer: 59 | Source: Ambulatory Visit | Attending: Urology | Admitting: Urology

## 2013-08-11 ENCOUNTER — Ambulatory Visit (HOSPITAL_COMMUNITY): Payer: 59 | Admitting: Anesthesiology

## 2013-08-11 ENCOUNTER — Encounter (HOSPITAL_COMMUNITY)
Admission: RE | Disposition: A | Discharge status: Home / Self Care | Payer: Self-pay | Source: Ambulatory Visit | Attending: Urology

## 2013-08-11 DIAGNOSIS — N401 Enlarged prostate with lower urinary tract symptoms: Secondary | ICD-10-CM | POA: Insufficient documentation

## 2013-08-11 DIAGNOSIS — N320 Bladder-neck obstruction: Secondary | ICD-10-CM | POA: Insufficient documentation

## 2013-08-11 DIAGNOSIS — Z01812 Encounter for preprocedural laboratory examination: Secondary | ICD-10-CM | POA: Insufficient documentation

## 2013-08-11 DIAGNOSIS — N138 Other obstructive and reflux uropathy: Principal | ICD-10-CM | POA: Insufficient documentation

## 2013-08-11 DIAGNOSIS — N400 Enlarged prostate without lower urinary tract symptoms: Secondary | ICD-10-CM | POA: Diagnosis present

## 2013-08-11 HISTORY — PX: TRANSURETHRAL RESECTION OF PROSTATE: SHX73

## 2013-08-11 LAB — GLUCOSE, CAPILLARY
Glucose-Capillary: 142 mg/dL — ABNORMAL HIGH (ref 70–99)
Glucose-Capillary: 257 mg/dL — ABNORMAL HIGH (ref 70–99)
Glucose-Capillary: 155 mg/dL — ABNORMAL HIGH (ref 70–99)

## 2013-08-11 SURGERY — TURP (TRANSURETHRAL RESECTION OF PROSTATE)
Anesthesia: Monitor Anesthesia Care

## 2013-08-11 MED ORDER — SIMVASTATIN 10 MG PO TABS
5.0000 mg | ORAL_TABLET | Freq: Every day | ORAL | Status: DC
  Administered 2013-08-11 – 2013-08-12 (×2): 5 mg via ORAL
  Filled 2013-08-11 (×2): qty 1

## 2013-08-11 MED ORDER — FENTANYL CITRATE 0.05 MG/ML IJ SOLN
25.0000 ug | INTRAMUSCULAR | Status: AC
  Administered 2013-08-11 (×2): 25 ug via INTRAVENOUS

## 2013-08-11 MED ORDER — ONDANSETRON HCL 4 MG/2ML IJ SOLN
4.0000 mg | Freq: Once | INTRAMUSCULAR | Status: AC
  Administered 2013-08-11: 4 mg via INTRAVENOUS

## 2013-08-11 MED ORDER — LACTATED RINGERS IV SOLN
INTRAVENOUS | Status: DC
  Administered 2013-08-11 (×2): via INTRAVENOUS

## 2013-08-11 MED ORDER — NON FORMULARY: Freq: Two times a day (BID) | Status: DC

## 2013-08-11 MED ORDER — BUPIVACAINE IN DEXTROSE 0.75-8.25 % IT SOLN
INTRATHECAL | Status: AC
  Filled 2013-08-11: qty 2

## 2013-08-11 MED ORDER — FENTANYL CITRATE 0.05 MG/ML IJ SOLN
INTRAMUSCULAR | Status: DC | PRN
  Administered 2013-08-11: 12.5 ug via INTRATHECAL

## 2013-08-11 MED ORDER — MIDAZOLAM HCL 5 MG/5ML IJ SOLN
INTRAMUSCULAR | Status: DC | PRN
  Administered 2013-08-11 (×4): 0.5 mg via INTRAVENOUS

## 2013-08-11 MED ORDER — IRBESARTAN 300 MG PO TABS
300.0000 mg | ORAL_TABLET | Freq: Every day | ORAL | Status: DC
  Administered 2013-08-12: 300 mg via ORAL
  Filled 2013-08-11: qty 1

## 2013-08-11 MED ORDER — MIDAZOLAM HCL 2 MG/2ML IJ SOLN
INTRAMUSCULAR | Status: AC
  Filled 2013-08-11: qty 2

## 2013-08-11 MED ORDER — SODIUM CHLORIDE 0.45 % IV SOLN
INTRAVENOUS | Status: DC
  Administered 2013-08-11 – 2013-08-12 (×3): via INTRAVENOUS

## 2013-08-11 MED ORDER — DAPAGLIFLOZIN PROPANEDIOL 5 MG PO TABS: 5.0000 mg | ORAL_TABLET | Freq: Every day | ORAL | Status: DC

## 2013-08-11 MED ORDER — BUPIVACAINE IN DEXTROSE 0.75-8.25 % IT SOLN
INTRATHECAL | Status: DC | PRN
  Administered 2013-08-11: 15 mg via INTRATHECAL

## 2013-08-11 MED ORDER — DEXAMETHASONE SODIUM PHOSPHATE 4 MG/ML IJ SOLN
4.0000 mg | Freq: Once | INTRAMUSCULAR | Status: AC
  Administered 2013-08-11: 4 mg via INTRAVENOUS

## 2013-08-11 MED ORDER — GLYCINE 1.5 % IR SOLN
Status: DC | PRN
  Administered 2013-08-11 (×13): 3000 mL

## 2013-08-11 MED ORDER — FENTANYL CITRATE 0.05 MG/ML IJ SOLN
INTRAMUSCULAR | Status: AC
  Filled 2013-08-11: qty 2

## 2013-08-11 MED ORDER — SCOPOLAMINE 1 MG/3DAYS TD PT72
1.0000 | MEDICATED_PATCH | Freq: Once | TRANSDERMAL | Status: DC
  Administered 2013-08-11: 1.5 mg via TRANSDERMAL

## 2013-08-11 MED ORDER — MIDAZOLAM HCL 2 MG/2ML IJ SOLN
1.0000 mg | INTRAMUSCULAR | Status: DC | PRN
  Administered 2013-08-11: 2 mg via INTRAVENOUS

## 2013-08-11 MED ORDER — PROPOFOL 10 MG/ML IV EMUL
INTRAVENOUS | Status: AC
  Filled 2013-08-11: qty 20

## 2013-08-11 MED ORDER — STERILE WATER FOR IRRIGATION IR SOLN
Status: DC | PRN
  Administered 2013-08-11: 1000 mL

## 2013-08-11 MED ORDER — FENTANYL CITRATE 0.05 MG/ML IJ SOLN
INTRAMUSCULAR | Status: DC | PRN
  Administered 2013-08-11 (×3): 12.5 ug via INTRAVENOUS
  Administered 2013-08-11 (×2): 25 ug via INTRAVENOUS

## 2013-08-11 MED ORDER — HYDROMORPHONE HCL PF 1 MG/ML IJ SOLN
1.0000 mg | INTRAMUSCULAR | Status: DC | PRN
  Administered 2013-08-12: 1 mg via INTRAVENOUS
  Filled 2013-08-11: qty 1

## 2013-08-11 MED ORDER — VALSARTAN-HYDROCHLOROTHIAZIDE 320-25 MG PO TABS: 1.0000 | ORAL_TABLET | Freq: Every day | ORAL | Status: DC

## 2013-08-11 MED ORDER — HYDROCHLOROTHIAZIDE 25 MG PO TABS
25.0000 mg | ORAL_TABLET | Freq: Every day | ORAL | Status: DC
  Administered 2013-08-12: 25 mg via ORAL
  Filled 2013-08-11: qty 1

## 2013-08-11 MED ORDER — ONDANSETRON HCL 4 MG/2ML IJ SOLN
INTRAMUSCULAR | Status: AC
  Filled 2013-08-11: qty 2

## 2013-08-11 MED ORDER — CARVEDILOL 12.5 MG PO TABS
12.5000 mg | ORAL_TABLET | Freq: Two times a day (BID) | ORAL | Status: DC
  Administered 2013-08-11 – 2013-08-12 (×3): 12.5 mg via ORAL
  Filled 2013-08-11 (×3): qty 1

## 2013-08-11 MED ORDER — DEXAMETHASONE SODIUM PHOSPHATE 4 MG/ML IJ SOLN
INTRAMUSCULAR | Status: AC
  Filled 2013-08-11: qty 1

## 2013-08-11 MED ORDER — PROPOFOL INFUSION 10 MG/ML OPTIME
INTRAVENOUS | Status: DC | PRN
  Administered 2013-08-11: 75 ug/kg/min via INTRAVENOUS
  Administered 2013-08-11: 15 ug/kg/min via INTRAVENOUS

## 2013-08-11 MED ORDER — ESOMEPRAZOLE MAGNESIUM 40 MG PO CPDR: 40.0000 mg | DELAYED_RELEASE_CAPSULE | Freq: Every day | ORAL | Status: DC

## 2013-08-11 MED ORDER — FENTANYL CITRATE 0.05 MG/ML IJ SOLN: 25.0000 ug | INTRAMUSCULAR | Status: DC | PRN

## 2013-08-11 MED ORDER — METFORMIN HCL ER 500 MG PO TB24
500.0000 mg | ORAL_TABLET | Freq: Two times a day (BID) | ORAL | Status: DC
  Administered 2013-08-11 – 2013-08-12 (×3): 500 mg via ORAL
  Filled 2013-08-11 (×6): qty 1

## 2013-08-11 MED ORDER — ONDANSETRON HCL 4 MG/2ML IJ SOLN: 4.0000 mg | Freq: Once | INTRAMUSCULAR | Status: DC | PRN

## 2013-08-11 MED ORDER — GLYCINE 1.5 % IR SOLN
Status: DC | PRN
  Administered 2013-08-11 (×2): 3000 mL

## 2013-08-11 MED ORDER — SCOPOLAMINE 1 MG/3DAYS TD PT72
MEDICATED_PATCH | TRANSDERMAL | Status: AC
  Filled 2013-08-11: qty 1

## 2013-08-11 MED ORDER — LIDOCAINE HCL (PF) 1 % IJ SOLN
INTRAMUSCULAR | Status: AC
  Filled 2013-08-11: qty 5

## 2013-08-11 SURGICAL SUPPLY — 37 items
BAG DECANTER FOR FLEXI CONT (MISCELLANEOUS) ×2 IMPLANT
BAG DRAIN URO TABLE W/ADPT NS (DRAPE) ×2 IMPLANT
BAG URINE DRAIN TURP 4L (OSTOMY) ×2 IMPLANT
CABLE HI FREQUENCY MONOPOLAR (ELECTROSURGICAL) ×2 IMPLANT
CATH FOLEY 3WAY 30CC 22F (CATHETERS) ×2 IMPLANT
CLOTH BEACON ORANGE TIMEOUT ST (SAFETY) ×2 IMPLANT
CONNECTOR 5 IN 1 STRAIGHT STRL (MISCELLANEOUS) ×2 IMPLANT
DRAPE STERI URO 23X35 APER SZ5 (DRAPE) ×2 IMPLANT
DRAPE WARM FLUID 44X44 (DRAPE) ×2 IMPLANT
ELECT CUT LOOP C-MAX 27FR .012 (CUTTING LOOP)
ELECT REM PT RETURN 9FT ADLT (ELECTROSURGICAL) ×2
ELECTRODE CUT LP CMX 27FR .012 (CUTTING LOOP) IMPLANT
ELECTRODE REM PT RTRN 9FT ADLT (ELECTROSURGICAL) ×1 IMPLANT
FLOOR PAD 36X40 (MISCELLANEOUS) ×2
FORMALIN 10 PREFIL 480ML (MISCELLANEOUS) ×2 IMPLANT
GLOVE BIO SURGEON STRL SZ7 (GLOVE) ×2 IMPLANT
GLOVE BIOGEL PI IND STRL 6.5 (GLOVE) ×1 IMPLANT
GLOVE BIOGEL PI IND STRL 7.0 (GLOVE) ×1 IMPLANT
GLOVE BIOGEL PI INDICATOR 6.5 (GLOVE) ×1
GLOVE BIOGEL PI INDICATOR 7.0 (GLOVE) ×1
GLOVE EXAM NITRILE MD LF STRL (GLOVE) ×2 IMPLANT
GLOVE SS BIOGEL STRL SZ 6.5 (GLOVE) ×2 IMPLANT
GLOVE SUPERSENSE BIOGEL SZ 6.5 (GLOVE) ×2
GLYCINE 1.5% IRRIG UROMATIC (IV SOLUTION) ×30 IMPLANT
GOWN STRL REIN XL XLG (GOWN DISPOSABLE) ×2 IMPLANT
IV NS IRRIG 3000ML ARTHROMATIC (IV SOLUTION) ×2 IMPLANT
KIT ROOM TURNOVER AP CYSTO (KITS) ×2 IMPLANT
MANIFOLD NEPTUNE II (INSTRUMENTS) ×2 IMPLANT
PACK CYSTO (CUSTOM PROCEDURE TRAY) ×2 IMPLANT
PAD ARMBOARD 7.5X6 YLW CONV (MISCELLANEOUS) ×2 IMPLANT
PAD FLOOR 36X40 (MISCELLANEOUS) ×1 IMPLANT
SET IRRIGATING DISP (SET/KITS/TRAYS/PACK) ×2 IMPLANT
SYR 30ML LL (SYRINGE) ×2 IMPLANT
TOWEL OR 17X26 4PK STRL BLUE (TOWEL DISPOSABLE) ×2 IMPLANT
WATER STERILE IRR 1000ML POUR (IV SOLUTION) ×2 IMPLANT
XPEEDA 550 SIDEFIRING FIBER (MISCELLANEOUS) IMPLANT
YANKAUER SUCT BULB TIP 10FT TU (MISCELLANEOUS) ×2 IMPLANT

## 2013-08-11 NOTE — Transfer of Care (Signed)
Immediate Anesthesia Transfer of Care Note  Patient: [name] [name]  Procedure(s) Performed: Procedure(s) (LRB): TRANSURETHRAL RESECTION OF THE PROSTATE (TURP) (N/A)  Patient Location: PACU  Anesthesia Type: SAB  Level of Consciousness: awake  Airway & Oxygen Therapy: Patient Spontanous Breathing and non-rebreather face mask  Post-op Assessment: Report given to PACU RN, Post -op Vital signs reviewed and stable. SAB Level  T 12  Post vital signs: Reviewed and stable  Complications: No apparent anesthesia complications

## 2013-08-11 NOTE — Op Note (Signed)
Op note dictated#[id number]

## 2013-08-11 NOTE — Anesthesia Preprocedure Evaluation (Signed)
Anesthesia Evaluation  Patient identified by MRN, date of birth, ID band Patient awake    Reviewed: Allergy & Precautions, H&P , NPO status , Patient's Chart, lab work & pertinent test results, reviewed documented beta blocker date and time   History of Anesthesia Complications (+) PONV and history of anesthetic complications  Airway Mallampati: III TM Distance: >3 FB Neck ROM: Full    Dental  (+) Poor Dentition, Missing, Chipped and Dental Advisory Given   Pulmonary sleep apnea (pos screen but denies  hx.) , former smoker,  breath sounds clear to auscultation        Cardiovascular hypertension, Pt. on medications Rhythm:Regular Rate:Normal     Neuro/Psych    GI/Hepatic GERD-  Medicated and Controlled,  Endo/Other  diabetes, Well Controlled, Type 2, Oral Hypoglycemic Agents  Renal/GU      Musculoskeletal   Abdominal   Peds  Hematology   Anesthesia Other Findings   Reproductive/Obstetrics                           Anesthesia Physical Anesthesia Plan  ASA: III  Anesthesia Plan: MAC and Spinal   Post-op Pain Management:    Induction: Intravenous  Airway Management Planned: Simple Face Mask  Additional Equipment:   Intra-op Plan:   Post-operative Plan:   Informed Consent: I have reviewed the patients History and Physical, chart, labs and discussed the procedure including the risks, benefits and alternatives for the proposed anesthesia with the patient or authorized representative who has indicated his/her understanding and acceptance.     Plan Discussed with:   Anesthesia Plan Comments:         Anesthesia Quick Evaluation

## 2013-08-11 NOTE — Anesthesia Postprocedure Evaluation (Addendum)
Anesthesia Post Note  Patient: [name] [name]  Procedure(s) Performed: Procedure(s) (LRB): TRANSURETHRAL RESECTION OF THE PROSTATE (TURP) (N/A)  Anesthesia type: Spinal  Patient location: PACU  Post pain: Pain level controlled  Post assessment: Post-op Vital signs reviewed, Patient's Cardiovascular Status Stable, Respiratory Function Stable, Patent Airway, No signs of Nausea or vomiting and Pain level controlled  Last Vitals:  Filed Vitals:   08/11/13 1245  BP: 108/64  Pulse: 62  Temp: 36.4 C  Resp: 13    Post vital signs: Reviewed and stable  Level of consciousness: awake and alert   Complications: No apparent anesthesia complications  08/12/13  Patient doing well.  VSS.  No apparent anesthesia complications.

## 2013-08-11 NOTE — Brief Op Note (Signed)
08/11/2013  12:39 PM  PATIENT:  [name] [name]  57 y.o. male  PRE-OPERATIVE DIAGNOSIS:  benign prostatic hypertrophy  POST-OPERATIVE DIAGNOSIS:  benign prostatic hypertrophy  PROCEDURE:  Procedure(s): TRANSURETHRAL RESECTION OF THE PROSTATE (TURP) (N/A)  SURGEON:  Surgeon(s) and Role:    * Ky Barban, MD - Primary none PHYSICIAN ASSISTANT:   ASSISTANTS:    ANESTHESIA:   spinal  EBL:  Total I/O In: 1250 [I.V.:1250] Out: 250 [Blood:250]  BLOOD ADMINISTERED:none  DRAINS: Urinary Catheter (Foley)   LOCAL MEDICATIONS USED:  NONE  SPECIMEN:  Source of Specimen:  prostate chips  DISPOSITION OF SPECIMEN:  PATHOLOGY  COUNTS:  YES  TOURNIQUET:  * No tourniquets in log *  DICTATION: .Other Dictation: Dictation Number dictation [id number]  PLAN OF CARE: Admit for overnight observation  PATIENT DISPOSITION:  PACU - hemodynamically stable.   Delay start of Pharmacological VTE agent (>24hrs) due to surgical blood loss or risk of bleeding:

## 2013-08-11 NOTE — Progress Notes (Signed)
Awake. Diet coke given to drink. Tolerated well. Continue to wait on room availability.

## 2013-08-11 NOTE — Progress Notes (Signed)
No change in H&P on reexamination. 

## 2013-08-11 NOTE — Anesthesia Procedure Notes (Signed)
Procedure Name: MAC Date/Time: 08/11/2013 10:44 AM Performed by: Franco Nones Pre-anesthesia Checklist: Patient identified, Emergency Drugs available, Suction available, Timeout performed and Patient being monitored Patient Re-evaluated:Patient Re-evaluated prior to inductionOxygen Delivery Method: Non-rebreather mask    Spinal  Patient location during procedure: OR Start time: 08/11/2013 10:50 AM End time: 08/11/2013 10:58 AM Staffing CRNA/Resident: Minerva Areola S Preanesthetic Checklist Completed: patient identified, site marked, surgical consent, pre-op evaluation, timeout performed, IV checked, risks and benefits discussed and monitors and equipment checked Spinal Block Patient position: left lateral decubitus Prep: Betadine Patient monitoring: heart rate, cardiac monitor, continuous pulse ox and blood pressure Approach: left paramedian Location: L3-4 Injection technique: single-shot Needle Needle type: Spinocan  Needle gauge: 22 G Needle length: 9 cm Assessment Sensory level: T8 Additional Notes Betadine prep x3 1% lidocaine skinwheal  Clear CSF pre and post injection  ATTEMPTS: 1 TRAY ID: [id number] TRAY EXPIRATION DATE: 2014-12

## 2013-08-11 NOTE — H&P (Signed)
[name] [name], [name] [name] NO.:  [id number]  MEDICAL RECORD NO.:  [id number]  LOCATION:  DOIB                          FACILITY:  [facility]  PHYSICIAN:  Ky Barban, M.D.DATE OF BIRTH:  1955-10-16  DATE OF ADMISSION:  08/05/2013 DATE OF DISCHARGE:  12/03/2014LH                             HISTORY & PHYSICAL   CHIEF COMPLAINT:  Symptoms of prostatism.  HISTORY OF PRESENT ILLNESS:  [name] [name] is a 57 year old gentleman who has a longstanding history of prostatism.  He says he has to strain to void.  Stream is slow and weak.  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  He has been taking Rapaflo for long time without any benefit.  He also told me his PSA was 8.9, and it is elevated.  He had 2 biopsies done in [place], which were negative.  He told me he will bring me the report.  Anyway, his urinary stream is slow and weak.  He gets up several times at night to go to the bathroom.  So, I have advised him to undergo TUR of prostate, but given him no guarantees about the results.  He is familiar with the complications.  He has read the whole thing on internet he told me, and he is aware about the procedures limitation, complications, especially no ejaculation.  He will come as outpatient tomorrow, will undergo TUR of prostate, will be kept overnight in the hospital.  Next day, if everything is okay, I will discharge him with the Foley catheter, which I will subsequently take it out in the office.  As I mentioned, I have run recently a cysto CMG in the office.  He does have trilobar hypertrophy with bladder neck obstruction.  His CMG is normal.  PERSONAL HISTORY:  Does not smoke or drink.  REVIEW OF SYSTEMS:  Unremarkable.  PHYSICAL EXAMINATION:  GENERAL:  Well-nourished,  well-developed male, not in acute distress, fully conscious, alert, oriented. VITAL SIGNS:  Blood pressure 143/87, temperature 97.9. CENTRAL NERVOUS SYSTEM:  No gross neurological deficit. HEAD, NECK, EYES, ENT:  Negative. CHEST:  Symmetrical.  Normal breath sounds. HEART:  Regular sinus rhythm.  No murmur. ABDOMEN:  Soft, flat.  Liver, spleen, kidneys not palpable.  No CVA tenderness. EXTERNAL GENITALIA:  Circumcised.  Meatus adequate.  Both testicles feel normal.  The __________ wound has healed up nicely.  Rectal exam, normal sphincter tone.  No rectal mass.  Prostate is 40 g, smooth and firm.  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  PLAN:  TUR of prostate under anesthesia, then keep him overnight further for observation.     Ky Barban, M.D.     MIJ/MEDQ  D:  08/10/2013  T:  08/11/2013  Job:  [id number]

## 2013-08-11 NOTE — H&P (Deleted)
NAME:  [name], [name]                  ACCOUNT NO.:  [id number]  MEDICAL RECORD NO.:  [id number]  LOCATION:  DOIB                          FACILITY:  [facility]  PHYSICIAN:  Mohammad I. Vanetta Rule, M.D.DATE OF BIRTH:  09/05/1955  DATE OF ADMISSION:  08/05/2013 DATE OF DISCHARGE:  12/03/2014LH                             HISTORY & PHYSICAL   CHIEF COMPLAINT:  Symptoms of prostatism.  HISTORY OF PRESENT ILLNESS:  [name] [name] is a 57-year-old gentleman who has a longstanding history of prostatism.  He says he has to strain to void.  Stream is slow and weak.  I have recently done bilateral vasectomy for stabilization in this patient, and he requested that he is having lot of problem voiding and he has been worked up in [place] and was told has BPH with bladder neck obstruction.  He has been taking Rapaflo for long time without any benefit.  He also told me his PSA was 8.9, and it is elevated.  He had 2 biopsies done in [place], which were negative.  He told me he will bring me the report.  Anyway, his urinary stream is slow and weak.  He gets up several times at night to go to the bathroom.  So, I have advised him to undergo TUR of prostate, but given him no guarantees about the results.  He is familiar with the complications.  He has read the whole thing on internet he told me, and he is aware about the procedures limitation, complications, especially no ejaculation.  He will come as outpatient tomorrow, will undergo TUR of prostate, will be kept overnight in the hospital.  Next day, if everything is okay, I will discharge him with the Foley catheter, which I will subsequently take it out in the office.  As I mentioned, I have run recently a cysto CMG in the office.  He does have trilobar hypertrophy with bladder neck obstruction.  His CMG is normal.  PERSONAL HISTORY:  Does not smoke or drink.  REVIEW OF SYSTEMS:  Unremarkable.  PHYSICAL EXAMINATION:  GENERAL:  Well-nourished,  well-developed male, not in acute distress, fully conscious, alert, oriented. VITAL SIGNS:  Blood pressure 143/87, temperature 97.9. CENTRAL NERVOUS SYSTEM:  No gross neurological deficit. HEAD, NECK, EYES, ENT:  Negative. CHEST:  Symmetrical.  Normal breath sounds. HEART:  Regular sinus rhythm.  No murmur. ABDOMEN:  Soft, flat.  Liver, spleen, kidneys not palpable.  No CVA tenderness. EXTERNAL GENITALIA:  Circumcised.  Meatus adequate.  Both testicles feel normal.  The __________ wound has healed up nicely.  Rectal exam, normal sphincter tone.  No rectal mass.  Prostate is 40 g, smooth and firm.  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  PLAN:  TUR of prostate under anesthesia, then keep him overnight further for observation.     Mohammad I. Selma Rodelo, M.D.     MIJ/MEDQ  D:  08/10/2013  T:  08/11/2013  Job:  [id number] 

## 2013-08-12 LAB — BASIC METABOLIC PANEL WITH GFR
BUN: 8 mg/dL (ref 6–23)
CO2: 28 meq/L (ref 19–32)
Calcium: 9.4 mg/dL (ref 8.4–10.5)
Chloride: 101 meq/L (ref 96–112)
Creatinine, Ser: 0.64 mg/dL (ref 0.50–1.35)
GFR calc Af Amer: >90 mL/min
GFR calc non Af Amer: >90 mL/min
Glucose, Bld: 127 mg/dL — ABNORMAL HIGH (ref 70–99)
Potassium: 3.6 meq/L (ref 3.5–5.1)
Sodium: 139 meq/L (ref 135–145)

## 2013-08-12 LAB — GLUCOSE, CAPILLARY
Glucose-Capillary: 144 mg/dL — ABNORMAL HIGH (ref 70–99)
Glucose-Capillary: 149 mg/dL — ABNORMAL HIGH (ref 70–99)
Glucose-Capillary: 181 mg/dL — ABNORMAL HIGH (ref 70–99)

## 2013-08-12 LAB — CBC
Hemoglobin: 13.3 g/dL (ref 13.0–17.0)
MCH: 29.1 pg (ref 26.0–34.0)
MCV: 83.8 fL (ref 78.0–100.0)
Platelets: 257 10*3/uL (ref 150–400)
RDW: 12.6 % (ref 11.5–15.5)

## 2013-08-12 MED ORDER — PANTOPRAZOLE SODIUM 40 MG PO TBEC
40.0000 mg | DELAYED_RELEASE_TABLET | Freq: Every day | ORAL | Status: DC
  Administered 2013-08-12: 40 mg via ORAL
  Filled 2013-08-12: qty 1

## 2013-08-12 MED ORDER — OXYCODONE-ACETAMINOPHEN 7.5-325 MG PO TABS: 1.0000 | ORAL_TABLET | Freq: Four times a day (QID) | ORAL | Status: DC | PRN

## 2013-08-12 NOTE — Progress Notes (Signed)
08/12/13 1609 Received call from Dr. Jerre Simon regarding patient condition. Notified voiding clear, yellow urine output with CBI. Order received to d/c CBI, leave foley catheter in place. Stated he will see patient this evening, probable discharge home. Earnstine Regal, RN

## 2013-08-12 NOTE — Progress Notes (Signed)
UR chart review completed.  

## 2013-08-12 NOTE — Progress Notes (Signed)
Inpatient Diabetes Program Recommendations  AACE/ADA: New Consensus Statement on Inpatient Glycemic Control (2013)  Target Ranges:  Prepandial:   less than 140 mg/dL      Peak postprandial:   less than 180 mg/dL (1-2 hours)      Critically ill patients:  140 - 180 mg/dL   Results for MATT, DELPIZZO (MRN [id number]) as of 08/12/2013 09:10  Ref. Range 08/11/2013 08:30 08/11/2013 17:40 08/11/2013 21:23 08/12/2013 07:07  Glucose-Capillary Latest Range: 70-99 mg/dL 409 (H) 811 (H) 914 (H) 144 (H)    Inpatient Diabetes Program Recommendations Correction (SSI): Please consider ordering CBGs with Novolog correction ACHS while inpatient. HgbA1C: Please consider ordering an A1C to determine glycemic control over the past 2-3 months.  Note: Patient has a history of diabetes and takes Metformin 500 mg BID as an outpatient for diabetes management.  Currently, patient is ordered to receive Metformin 500 mg BID for inpatient glycemic control.  Please consider ordering CBGs with Novolog correction ACHS and an A1C.  Will continue to follow while inpatient.  Thanks, Orlando Penner, RN, MSN, CCRN Diabetes Coordinator Inpatient Diabetes Program [phone] (Team Pager) [phone] ([facility] office) [phone] [facility] office)

## 2013-08-12 NOTE — Progress Notes (Signed)
Feels fine afebrile. Wbc elevated to 18.2  Probably because of sugery he looks fine no complaints. Will discharge home with ctheter and take it out on Monday.we have dc cbi he is oob on regular diet.

## 2013-08-12 NOTE — Progress Notes (Signed)
08/12/13 1935 Reviewed discharge instructions with patient, wife at bedside. Given copy of instructions, medication list, prescription, f/u appointment as scheduled. Noted when home medications next due on list. IV site d/c'd per nurse tech, within normal limits. Pt and wife verbalize understanding of instructions, when to call MD. No c/o pain or discomfort at time of discharge. Pt left floor in stable condition via w/c accompanied by nurse tech. Earnstine Regal, RN

## 2013-08-12 NOTE — Op Note (Signed)
NAMECHRISTOPHERE, HILLHOUSE NO.:  [id number]  MEDICAL RECORD NO.:  [id number]  LOCATION:  A310                          FACILITY:  [facility]  PHYSICIAN:  Ky Barban, M.D.DATE OF BIRTH:  29-Jan-1956  DATE OF PROCEDURE: DATE OF DISCHARGE:                              OPERATIVE REPORT   PREOPERATIVE DIAGNOSIS:  Benign prostatic hypertrophy.  POSTOPERATIVE DIAGNOSIS:  Benign prostatic hypertrophy.  PROCEDURE:  TUR prostate.  ANESTHESIA:  Spinal.  DESCRIPTION OF PROCEDURE:  The patient under spinal anesthesia in lithotomy position.  After usual prep and drape, #28 Iglesias resectoscope was introduced into the bladder.  Once again, bladder was inspected.  The position of the orifice was identified.  Resectoscope pulled back in mid prostatic urethra.  It is a rather large prostate. Bladder neck was circumferentially resected down to the circular fibers. The bleeders were coagulated.  Resectoscope was pulled back at the level of the verumontanum and rotated to 11 o'clock position.  Resection of the right lobe was started at 11 o'clock position.  A groove was created from bladder neck to the level of the verumontanum.  Then, between 11 and 7 o'clock position, the right lobe was completely resected, most of it was resected, then the left lobe was resected between 1 and 5 o'clock position in the same way.  He has rather long prostatic urethra.  I had to take 2 bites to get to the verumontanum.  Anyway most of the adenoma was resected.  There was still considerable tissue in the lateral lobes and the bladder neck looks open.  Bleeders were coagulated.  Chips were evacuated.  At this point, resectoscope was removed and 22 three-way Foley catheter was inserted.  CBI started, which was absolutely clear. The patient left the operating room in satisfactory condition.     Ky Barban, M.D.     MIJ/MEDQ  D:  08/11/2013  T:  08/12/2013  Job:  [id number]

## 2013-08-12 NOTE — Plan of Care (Signed)
Problem: Phase I Progression Outcomes Goal: Voiding-avoid urinary catheter unless indicated Outcome: Not Applicable Date Met:  08/12/13 08/12/13 1931 Patient to keep foley catheter in place for discharge per Dr. Jerre Simon. Switched to leg bag as instructed, regular foley collection bag provided per MD request. Pt and wife demonstrate correct catheter care/emptying. Earnstine Regal ,RN

## 2013-08-14 ENCOUNTER — Encounter (HOSPITAL_COMMUNITY): Payer: Self-pay | Admitting: Urology

## 2013-09-07 NOTE — H&P (Signed)
[phone]#[id number]

## 2013-09-08 NOTE — Discharge Summary (Signed)
NAMCharlett Lango:  [name], [name]                  ACCOUNT NO.:  [id number]  MEDICAL RECORD NO.:  [id number]  LOCATION:  A310                          FACILITY:  [facility]  PHYSICIAN:  Ky BarbanMohammad I. Brylon Brenning, M.D.DATE OF BIRTH:  1956-07-31  DATE OF ADMISSION:  08/11/2013 DATE OF DISCHARGE:  12/10/2014LH                              DISCHARGE SUMMARY   CHIEF COMPLAINT:  Symptoms of prostatism.  HISTORY:  [name] [name] is 58 year old gentleman, recently __________ in the office then he told me that he is having lot of symptoms of prostatism for some time.  Urinary stream is slow and week.  He has nocturia and he underwent cystoscopy in the office, and was found to have enlarged prostate with bladder neck obstruction, so I advised him to undergo TUR prostate.  He has been taking Flomax __________ without any benefit.  He says he does want to try that anymore.  I told him about the procedure, risks, limitation, and complications.  He understood and want me to go ahead and proceed.  He underwent routine preadmission workup __________ is normal.  He was taken to the operating room and TUR prostate was done.  His blood pressure is 143/87, temperature is 97.9.  Postop course was benign.  Next day, his urine was essentially clear, so I discontinued his CBI, and after few hours, it was still clear, so I decided to discharge him home with Foley catheter. He will come to the office in a couple of days.  I will give him a voiding trial.  I have told him if he has any difficulty or bleeding to let me know.  FINAL DISCHARGE DIAGNOSIS:  Pathology report is still pending.  He is advised to continue his usual medications, which he was taking, Coreg for blood pressure along with the Nexium.  I gave him a prescription for hydrocodone.  He should continue taking metformin for his type 2 diabetes.  I am going to see him back in the office in couple of days to take his catheter out.     Ky BarbanMohammad I. Rashaunda Rahl,  M.D.     MIJ/MEDQ  D:  09/07/2013  T:  09/08/2013  Job:  [id number]

## 2017-05-23 ENCOUNTER — Encounter: Payer: Self-pay | Admitting: Family Medicine

## 2019-04-06 ENCOUNTER — Other Ambulatory Visit: Payer: Self-pay

## 2019-04-06 ENCOUNTER — Ambulatory Visit
Admission: EM | Admit: 2019-04-06 | Discharge: 2019-04-06 | Disposition: A | Discharge status: Home / Self Care | Payer: 59 | Attending: Emergency Medicine | Admitting: Emergency Medicine

## 2019-04-06 DIAGNOSIS — M5442 Lumbago with sciatica, left side: Secondary | ICD-10-CM

## 2019-04-06 DIAGNOSIS — E119 Type 2 diabetes mellitus without complications: Secondary | ICD-10-CM

## 2019-04-06 DIAGNOSIS — M62830 Muscle spasm of back: Secondary | ICD-10-CM

## 2019-04-06 MED ORDER — PREDNISONE 20 MG PO TABS: 20.0000 mg | ORAL_TABLET | Freq: Two times a day (BID) | ORAL | 0 refills | Status: AC

## 2019-04-06 MED ORDER — CYCLOBENZAPRINE HCL 10 MG PO TABS: 10.0000 mg | ORAL_TABLET | Freq: Every day | ORAL | 0 refills | Status: DC

## 2019-04-06 NOTE — ED Triage Notes (Signed)
Pt injures back last week while moving heavy object, pain in lower back radiating down left leg

## 2019-04-06 NOTE — ED Provider Notes (Signed)
[facility]   [id number] 04/06/19 Arrival Time: 1042  CC: Back PAIN  SUBJECTIVE: History from: patient. Hiren Augustine RadarL Danser is a 63 y.o. male complains of left sided low back pain that began 1 week ago.  Symptoms began after unloading approximately 20 bags of gravel. Localizes the pain to the left low back.  Describes the pain as constant and sharp/ numb in character.  Has tried OTC ibuprofen with initial relief, but symptoms have persists.  Symptoms are made worse with walking and at night.  Reports similar symptoms in the past.  Denies fever, chills, chest pain, SOB, erythema, ecchymosis, effusion, weakness, numbness and tingling, saddle paresthesias, loss of bowel or bladder function.    Pt's hx significant for DM. Well-controlled.  Blood glucose on average 120-160.  Last A1C 6.5.  Does admit to mild peripheral neuropathy in LLE.      ROS: As per HPI.  All other pertinent ROS negative.     Past Medical History:  Diagnosis Date  . Acid reflux disease   . Chest pain, unspecified   . Derangement of posterior horn of lateral meniscus   . Derangement of posterior horn of medial meniscus   . Diabetes mellitus without complication (HCC)   . DJD (degenerative joint disease)   . Elevated PSA    requiring several bx  . Hyperlipidemia, mixed   . Hypertension   . PONV (postoperative nausea and vomiting)   . Sleep apnea    Stop Bang score of 5, per pt he has been screened for sleep apnea, but they said he didn't have it  . Tachycardia   . Tear of anterior cruciate ligament of knee    bilateral   Past Surgical History:  Procedure Laterality Date  . KNEE ARTHROSCOPY     Bilateral  . NASAL SEPTUM SURGERY    . PROSTATE BIOPSY  09/22/08    x 3  . TONSILLECTOMY AND ADENOIDECTOMY    . TRANSURETHRAL RESECTION OF PROSTATE N/A 08/11/2013   Procedure: TRANSURETHRAL RESECTION OF THE PROSTATE (TURP);  Surgeon: Ky BarbanMohammad I Javaid, MD;  Location: [facility];  Service: Urology;  Laterality: N/A;  .  VASECTOMY     No Known Allergies No current facility-administered medications on file prior to encounter.    Current Outpatient Medications on File Prior to Encounter  Medication Sig Dispense Refill  . carvedilol (COREG) 12.5 MG tablet Take 12.5 mg by mouth 2 (two) times daily with a meal.    . Dapagliflozin Propanediol (FARXIGA) 5 MG TABS Take 5 mg by mouth daily.    Marland Kitchen. esomeprazole (NEXIUM) 40 MG capsule Take 40 mg by mouth daily before breakfast.      . metFORMIN (GLUCOPHAGE-XR) 500 MG 24 hr tablet Take 500 mg by mouth 2 (two) times daily.    . pravastatin (PRAVACHOL) 40 MG tablet Take 40 mg by mouth daily.      . valsartan-hydrochlorothiazide (DIOVAN-HCT) 320-25 MG per tablet Take 1 tablet by mouth daily.       Social History   Socioeconomic History  . Marital status: Married    Spouse name: Not on file  . Number of children: Not on file  . Years of education: Not on file  . Highest education level: Not on file  Occupational History  . Not on file  Social Needs  . Financial resource strain: Not on file  . Food insecurity    Worry: Not on file    Inability: Not on file  . Transportation needs  Medical: Not on file    Non-medical: Not on file  Tobacco Use  . Smoking status: Former Smoker    Packs/day: 2.00    Years: 35.00    Pack years: 70.00    Types: Cigarettes  . Smokeless tobacco: Former Systems developer    Quit date: 09/04/2007  Substance and Sexual Activity  . Alcohol use: Yes    Comment: very rare occasion  . Drug use: No  . Sexual activity: Not on file  Lifestyle  . Physical activity    Days per week: Not on file    Minutes per session: Not on file  . Stress: Not on file  Relationships  . Social Herbalist on phone: Not on file    Gets together: Not on file    Attends religious service: Not on file    Active member of club or organization: Not on file    Attends meetings of clubs or organizations: Not on file    Relationship status: Not on file  .  Intimate partner violence    Fear of current or ex partner: Not on file    Emotionally abused: Not on file    Physically abused: Not on file    Forced sexual activity: Not on file  Other Topics Concern  . Not on file  Social History Narrative  . Not on file   History reviewed. No pertinent family history.  OBJECTIVE:  Vitals:   04/06/19 1054  BP: (!) 152/95  Pulse: 83  Resp: 20  Temp: 98.1 F (36.7 C)  SpO2: 96%    General appearance: ALERT; in no acute distress.  Head: NCAT Lungs: Normal respiratory effort; CTAB CV: RRR Musculoskeletal: Low back Inspection: Skin warm, dry, clear and intact without obvious erythema, effusion, or ecchymosis.  Palpation: TTP over left lower back ROM: FROM active and passive Strength: 5/5 shld abduction, 5/5 shld adduction, 5/5 elbow flexion, 5/5 elbow extension, 5/5 grip strength, 5/5 hip flexion, 5/5 knee abduction, 5/5 knee adduction, 5/5 knee flexion, 5/5 knee extension Skin: warm and dry Neurologic: Ambulates with minimal difficulty; Sensation intact about the upper/ lower extremities Psychological: alert and cooperative; normal mood and affect  ASSESSMENT & PLAN:  1. Acute left-sided low back pain with left-sided sciatica   2. Back spasm   3. Type 2 diabetes mellitus without complication, without long-term current use of insulin ([place])     Meds ordered this encounter  Medications  . predniSONE (DELTASONE) 20 MG tablet    Sig: Take 1 tablet (20 mg total) by mouth 2 (two) times daily with a meal for 5 days.    Dispense:  10 tablet    Refill:  0    Order Specific Question:   Supervising Provider    Answer:   Raylene Everts [[id number]]  . cyclobenzaprine (FLEXERIL) 10 MG tablet    Sig: Take 1 tablet (10 mg total) by mouth at bedtime.    Dispense:  15 tablet    Refill:  0    Order Specific Question:   Supervising Provider    Answer:   Raylene Everts [[id number]]   Continue conservative management of rest, ice, and gentle  stretches/ massage Take prednisone as prescribed and to completion Take cyclobenzaprine at nighttime for symptomatic relief. Avoid driving or operating heavy machinery while using medication. Follow up with PCP if symptoms persist Present to ER if worsening or new symptoms (fever, chills, chest pain, abdominal pain, changes in bowel or bladder habits, pain  radiating into lower legs, etc...)   Reviewed expectations re: course of current medical issues. Questions answered. Outlined signs and symptoms indicating need for more acute intervention. Patient verbalized understanding. After Visit Summary given.    Rennis HardingWurst, Reilly Molchan, PA-C 04/06/19 1141

## 2019-04-06 NOTE — Discharge Instructions (Addendum)
Continue conservative management of rest, ice, and gentle stretches/ massage Take prednisone as prescribed and to completion Take cyclobenzaprine at nighttime for symptomatic relief. Avoid driving or operating heavy machinery while using medication. Follow up with PCP if symptoms persist Present to ER if worsening or new symptoms (fever, chills, chest pain, abdominal pain, changes in bowel or bladder habits, pain radiating into lower legs, etc...)

## 2020-05-03 ENCOUNTER — Encounter: Payer: Self-pay | Admitting: Urology

## 2020-05-03 ENCOUNTER — Other Ambulatory Visit: Payer: Self-pay

## 2020-05-03 ENCOUNTER — Ambulatory Visit (INDEPENDENT_AMBULATORY_CARE_PROVIDER_SITE_OTHER): Payer: 59 | Admitting: Urology

## 2020-05-03 VITALS — BP 104/74 | HR 87 | Temp 98.2°F | Ht 68.0 in | Wt 216.0 lb

## 2020-05-03 DIAGNOSIS — R972 Elevated prostate specific antigen [PSA]: Secondary | ICD-10-CM

## 2020-05-03 DIAGNOSIS — R310 Gross hematuria: Secondary | ICD-10-CM

## 2020-05-03 LAB — URINALYSIS, ROUTINE W REFLEX MICROSCOPIC
Bilirubin, UA: NEGATIVE
Ketones, UA: NEGATIVE
Leukocytes,UA: NEGATIVE
Nitrite, UA: NEGATIVE
Protein,UA: NEGATIVE
RBC, UA: NEGATIVE
Specific Gravity, UA: 1.01 (ref 1.005–1.030)
Urobilinogen, Ur: 0.2 mg/dL (ref 0.2–1.0)
pH, UA: 6 (ref 5.0–7.5)

## 2020-05-03 NOTE — Progress Notes (Signed)
Urological Symptom Review  Patient is experiencing the following symptoms: Frequent urination Get up at night to urinate Blood in urine Kidney stone   Review of Systems  Gastrointestinal (upper)  : Indigestion/heartburn  Gastrointestinal (lower) : Negative for lower GI symptoms  Constitutional : Fatigue  Skin: Negative for skin symptoms  Eyes: Negative for eye symptoms  Ear/Nose/Throat : Negative for Ear/Nose/Throat symptoms  Hematologic/Lymphatic: Negative for Hematologic/Lymphatic symptoms  Cardiovascular : Negative for cardiovascular symptoms  Respiratory : Negative for respiratory symptoms  Endocrine: Excessive thirst  Musculoskeletal: Back pain  Neurological: Negative for neurological symptoms  Psychologic: Negative for psychiatric symptoms

## 2020-05-03 NOTE — Progress Notes (Signed)
H&P  Chief Complaint: Gross Hematuria  History of Present Illness: Pt reports experiencing gross hematuria and clots starting 2.24.2021. Pt notes that blood is present throughout is urine and clots pass approximately half-way through his stream. Pt reports no new aches or pains in his bones and torso. Pt reports that TURP was successful in alleviating his BPH symptoms. He does have a history of smoking but quit about 12 years ago. He also has a history of recurrent urolithiasis.  (below copied from [facility] records):  4.12.2012: Mr Quiroa has a history of elevated PSA and had 2 negative prostate biopsies by Dr. Brunilda Payor. The last was in 9/10. His first biopsy was benign but the last had some inflammation. His last PSA in 5/11 was 8.15. His PSA has generally been between 12 and 18 and he has had a PCA3 that was negative. He has had some intermittant right testicular swelling that would last for a couple of hours in the middle of the day and then resolve. He has had 2 episodes in the last 3 months. For the last 3 weeks he has had a dull low back. He is voiding well most of the time but can have occasional sensation of incomplete emptying.   PSA: 05/2009 - 14.5 08/2009 - 18.4 01/11/2010 - 8.15 12/05/2010 - 6.67  Past Medical History:  Diagnosis Date  . Acid reflux disease   . Chest pain, unspecified   . Derangement of posterior horn of lateral meniscus   . Derangement of posterior horn of medial meniscus   . Diabetes mellitus without complication (HCC)   . DJD (degenerative joint disease)   . Elevated PSA    requiring several bx  . Hyperlipidemia, mixed   . Hypertension   . PONV (postoperative nausea and vomiting)   . Sleep apnea    Stop Bang score of 5, per pt he has been screened for sleep apnea, but they said he didn't have it  . Tachycardia   . Tear of anterior cruciate ligament of knee    bilateral    Past Surgical History:  Procedure Laterality Date  . KNEE ARTHROSCOPY     Bilateral    . NASAL SEPTUM SURGERY    . PROSTATE BIOPSY  09/22/08    x 3  . TONSILLECTOMY AND ADENOIDECTOMY    . TRANSURETHRAL RESECTION OF PROSTATE N/A 08/11/2013   Procedure: TRANSURETHRAL RESECTION OF THE PROSTATE (TURP);  Surgeon: Ky Barban, MD;  Location: [facility];  Service: Urology;  Laterality: N/A;  . VASECTOMY      Home Medications:  Allergies as of 05/03/2020   No Known Allergies     Medication List       Accurate as of May 03, 2020  9:21 AM. If you have any questions, ask your nurse or doctor.        carvedilol 12.5 MG tablet Commonly known as: COREG Take 12.5 mg by mouth 2 (two) times daily with a meal.   cyclobenzaprine 10 MG tablet Commonly known as: FLEXERIL Take 1 tablet (10 mg total) by mouth at bedtime.   esomeprazole 40 MG capsule Commonly known as: NEXIUM Take 40 mg by mouth daily before breakfast.   Farxiga 5 MG Tabs tablet Generic drug: dapagliflozin propanediol Take 5 mg by mouth daily.   metFORMIN 500 MG 24 hr tablet Commonly known as: GLUCOPHAGE-XR Take 500 mg by mouth 2 (two) times daily.   pravastatin 40 MG tablet Commonly known as: PRAVACHOL Take 40 mg by mouth daily.  valsartan-hydrochlorothiazide 320-25 MG tablet Commonly known as: DIOVAN-HCT Take 1 tablet by mouth daily.       Allergies: No Known Allergies  No family history on file.  Social History:  reports that he has quit smoking. His smoking use included cigarettes. He has a 70.00 pack-year smoking history. He quit smokeless tobacco use about 12 years ago. He reports current alcohol use. He reports that he does not use drugs.  ROS: Urological Symptom Review  Patient is experiencing the following symptoms: Frequent urination Get up at night to urinate Blood in urine Kidney stone   Review of Systems  Gastrointestinal (upper)  : Indigestion/heartburn  Gastrointestinal (lower) : Negative for lower GI symptoms  Constitutional : Fatigue  Skin: Negative  for skin symptoms  Eyes: Negative for eye symptoms  Ear/Nose/Throat : Negative for Ear/Nose/Throat symptoms  Hematologic/Lymphatic: Negative for Hematologic/Lymphatic symptoms  Cardiovascular : Negative for cardiovascular symptoms  Respiratory : Negative for respiratory symptoms  Endocrine: Excessive thirst  Musculoskeletal: Back pain  Neurological: Negative for neurological symptoms  Psychologic: Negative for psychiatric symptomsUrological Symptom Review  Patient is experiencing the following symptoms: Frequent urination Get up at night to urinate Blood in urine Kidney stone   Review of Systems  Gastrointestinal (upper)  : Indigestion/heartburn  Gastrointestinal (lower) : Negative for lower GI symptoms  Constitutional : Fatigue  Skin: Negative for skin symptoms  Eyes: Negative for eye symptoms  Ear/Nose/Throat : Negative for Ear/Nose/Throat symptoms  Hematologic/Lymphatic: Negative for Hematologic/Lymphatic symptoms  Cardiovascular : Negative for cardiovascular symptoms  Respiratory : Negative for respiratory symptoms  Endocrine: Excessive thirst  Musculoskeletal: Back pain  Neurological: Negative for neurological symptoms  Psychologic: Negative for psychiatric symptoms  Physical Exam:  Vital signs in last 24 hours: There were no vitals taken for this visit. Constitutional:  Alert and oriented, No acute distress Cardiovascular: Regular rate  Respiratory: Normal respiratory effort GI: Abdomen is soft, nontender, nondistended, no abdominal masses. No CVAT. No inguinal hernias. Umbilical hernia present Genitourinary: Normal male phallus, testes are descended bilaterally and non-tender and without masses, scrotum is normal in appearance without lesions or masses, perineum is normal on inspection. Prostate feels about 50 grams in size. Lymphatic: No lymphadenopathy Neurologic: Grossly intact, no focal  deficits Psychiatric: Normal mood and affect  I have reviewed prior pt notes  I have reviewed notes from referring/previous physicians  I have reviewed urinalysis results  I have independently reviewed prior imaging  I have reviewed prior PSA results   Impression/Assessment:  Early new onset gross painless hematuria. His history is significant for tobacco abuse and urolithiasis. Exam today is negative  History of elevated PSA with 2 - biopsies. He is not aware recent PSAs.  Plan:  1. Pt to have PSA and BUN/Creatinine drawn today.  2. Pt to be scheduled for next available CT (hematuria protocol) and   3. F/U after CT with OV and Cysto.  CC: Dr. Nita Sells

## 2020-05-04 LAB — BUN+CREAT
BUN/Creatinine Ratio: 11 (ref 10–24)
BUN: 9 mg/dL (ref 8–27)
Creatinine, Ser: 0.82 mg/dL (ref 0.76–1.27)
GFR calc Af Amer: 110 mL/min/{1.73_m2} (ref >59)
GFR calc non Af Amer: 95 mL/min/{1.73_m2} (ref >59)

## 2020-05-04 LAB — PSA: Prostate Specific Ag, Serum: 5 ng/mL — ABNORMAL HIGH (ref 0.0–4.0)

## 2020-05-05 NOTE — Progress Notes (Signed)
Sent via mychart

## 2020-05-27 ENCOUNTER — Ambulatory Visit (HOSPITAL_COMMUNITY)
Admission: RE | Admit: 2020-05-27 | Discharge: 2020-05-27 | Disposition: A | Discharge status: Home / Self Care | Payer: 59 | Source: Ambulatory Visit | Attending: Urology | Admitting: Urology

## 2020-05-27 ENCOUNTER — Other Ambulatory Visit: Payer: Self-pay

## 2020-05-27 ENCOUNTER — Encounter (HOSPITAL_COMMUNITY): Payer: Self-pay

## 2020-05-27 DIAGNOSIS — R310 Gross hematuria: Secondary | ICD-10-CM

## 2020-05-27 MED ORDER — IOHEXOL 300 MG/ML  SOLN
125.0000 mL | Freq: Once | INTRAMUSCULAR | Status: AC | PRN
  Administered 2020-05-27: 16:00:00 125 mL via INTRAVENOUS

## 2020-06-07 ENCOUNTER — Ambulatory Visit (INDEPENDENT_AMBULATORY_CARE_PROVIDER_SITE_OTHER): Payer: 59 | Admitting: Urology

## 2020-06-07 ENCOUNTER — Encounter: Payer: Self-pay | Admitting: Urology

## 2020-06-07 ENCOUNTER — Other Ambulatory Visit: Payer: Self-pay

## 2020-06-07 VITALS — BP 132/82 | HR 103 | Temp 98.1°F | Ht 68.0 in | Wt 216.0 lb

## 2020-06-07 DIAGNOSIS — R972 Elevated prostate specific antigen [PSA]: Secondary | ICD-10-CM | POA: Diagnosis not present

## 2020-06-07 DIAGNOSIS — N138 Other obstructive and reflux uropathy: Secondary | ICD-10-CM | POA: Diagnosis not present

## 2020-06-07 DIAGNOSIS — N401 Enlarged prostate with lower urinary tract symptoms: Secondary | ICD-10-CM

## 2020-06-07 DIAGNOSIS — R310 Gross hematuria: Secondary | ICD-10-CM

## 2020-06-07 LAB — URINALYSIS, ROUTINE W REFLEX MICROSCOPIC
Bilirubin, UA: NEGATIVE
Ketones, UA: NEGATIVE
Leukocytes,UA: NEGATIVE
Nitrite, UA: NEGATIVE
Protein,UA: NEGATIVE
RBC, UA: NEGATIVE
Specific Gravity, UA: 1.01 (ref 1.005–1.030)
Urobilinogen, Ur: 0.2 mg/dL (ref 0.2–1.0)
pH, UA: 5.5 (ref 5.0–7.5)

## 2020-06-07 MED ORDER — CIPROFLOXACIN HCL 500 MG PO TABS
500.0000 mg | ORAL_TABLET | Freq: Once | ORAL | Status: AC
  Administered 2020-06-07: 14:00:00 500 mg via ORAL

## 2020-06-07 NOTE — Progress Notes (Signed)
H&P  Chief Complaint: Gross Hematuria  History of Present Illness:  10.5.2021: [name] [name] is here for cysto to f/u his gross hematuria. Pt reports only 1 episode of gross hematuria following his 8.31.2021 appointment.  CT hematuira 9.25.2021: IMPRESSION: 1. No nephroureterolithiasis. No hydronephrosis. No suspicious enhancing renal masses. No filling defects identified within the opacified renal collecting systems and ureters. 2. Enlarged heterogeneous prostate.  IPSS Questionnaire (AUA-7): Over the past month.   1)  How often have you had a sensation of not emptying your bladder completely after you finish urinating?  0 - Not at all  2)  How often have you had to urinate again less than two hours after you finished urinating? 3 - About half the time  3)  How often have you found you stopped and started again several times when you urinated?  0 - Not at all  4) How difficult have you found it to postpone urination?  0 - Not at all  5) How often have you had a weak urinary stream?  0 - Not at all  6) How often have you had to push or strain to begin urination?  0 - Not at all  7) How many times did you most typically get up to urinate from the time you went to bed until the time you got up in the morning?  1 - 1 time  Total score:  0-7 mildly symptomatic   8-19 moderately symptomatic   20-35 severely symptomatic  QOL score: 2   (below copied from [facility] records):  4.12.2012: [name] [name] has a history of elevated PSA and had 2 negative prostate biopsies by Dr. Brunilda Payor. The last was in 9/10. His first biopsy was benign but the last had some inflammation. His last PSA in 5/11 was 8.15. His PSA has generally been between 12 and 18 and he has had a PCA3 that was negative. He has had some intermittant right testicular swelling that would last for a couple of hours in the middle of the day and then resolve. He has had 2 episodes in the last 3 months. For the last 3 weeks he has had a dull low back. He  is voiding well most of the time but can have occasional sensation of incomplete emptying.   PSA: 05/2009 - 14.5 08/2009 - 18.4 01/11/2010 - 8.15 12/05/2010 - 6.67  8.31.2021: Pt reports experiencing gross hematuria and clots starting 2.24.2021. Pt notes that blood is present throughout is urine and clots pass approximately half-way through his stream. Pt reports no new aches or pains in his bones and torso. Pt reports that TURP was successful in alleviating his BPH symptoms. He does have a history of smoking but quit about 12 years ago. He also has a history of recurrent urolithiasis.  Past Medical History:  Diagnosis Date  . Acid reflux disease   . Arthritis   . Chest pain, unspecified   . Derangement of posterior horn of lateral meniscus   . Derangement of posterior horn of medial meniscus   . Diabetes mellitus without complication (HCC)   . DJD (degenerative joint disease)   . Elevated PSA    requiring several bx  . GERD (gastroesophageal reflux disease)   . Hyperlipidemia, mixed   . Hypertension   . PONV (postoperative nausea and vomiting)   . Sleep apnea    Stop Bang score of 5, per pt he has been screened for sleep apnea, but they said he didn't have it  .  Tachycardia   . Tear of anterior cruciate ligament of knee    bilateral    Past Surgical History:  Procedure Laterality Date  . KNEE ARTHROSCOPY     Bilateral  . NASAL SEPTUM SURGERY    . PROSTATE BIOPSY  09/22/08    x 3  . TONSILLECTOMY AND ADENOIDECTOMY    . TRANSURETHRAL RESECTION OF PROSTATE N/A 08/11/2013   Procedure: TRANSURETHRAL RESECTION OF THE PROSTATE (TURP);  Surgeon: Ky Barban, MD;  Location: [facility];  Service: Urology;  Laterality: N/A;  . VASECTOMY      Home Medications:  Allergies as of 06/07/2020   No Known Allergies     Medication List       Accurate as of June 07, 2020 12:49 PM. If you have any questions, ask your nurse or doctor.        carvedilol 12.5 MG tablet Commonly  known as: COREG Take 12.5 mg by mouth 2 (two) times daily with a meal.   cyclobenzaprine 10 MG tablet Commonly known as: FLEXERIL Take 1 tablet (10 mg total) by mouth at bedtime.   esomeprazole 40 MG capsule Commonly known as: NEXIUM Take 40 mg by mouth daily before breakfast.   Farxiga 5 MG Tabs tablet Generic drug: dapagliflozin propanediol Take 5 mg by mouth daily.   furosemide 20 MG tablet Commonly known as: LASIX Take 20 mg by mouth.   metFORMIN 500 MG 24 hr tablet Commonly known as: GLUCOPHAGE-XR Take 500 mg by mouth 2 (two) times daily.   olmesartan 40 MG tablet Commonly known as: BENICAR Take 40 mg by mouth daily.   pravastatin 40 MG tablet Commonly known as: PRAVACHOL Take 40 mg by mouth daily.   Trulicity 3 MG/0.5ML Sopn Generic drug: Dulaglutide Inject into the skin.   valsartan-hydrochlorothiazide 320-25 MG tablet Commonly known as: DIOVAN-HCT Take 1 tablet by mouth daily.   Vascepa 1 g capsule Generic drug: icosapent Ethyl Take 2 g by mouth 2 (two) times daily.       Allergies: No Known Allergies  No family history on file.  Social History:  reports that he has quit smoking. His smoking use included cigarettes. He has a 70.00 pack-year smoking history. He quit smokeless tobacco use about 12 years ago. He reports current alcohol use. He reports that he does not use drugs.  ROS: A complete review of systems was performed.  All systems are negative except for pertinent findings as noted.  Physical Exam:  Vital signs in last 24 hours: There were no vitals taken for this visit. Constitutional:  Alert and oriented, No acute distress Respiratory: Normal respiratory effort Genitourinary: Normal male phallus, testes are descended bilaterally and non-tender and without masses, scrotum is normal in appearance without lesions or masses, perineum is normal on inspection. Neurologic: Grossly intact, no focal deficits Psychiatric: Normal mood and  affect  I have reviewed prior pt notes  I have reviewed notes from referring/previous physicians  I have reviewed urinalysis results  I have independently reviewed prior imaging--CT images  I have reviewed prior PSA results  Cystoscopy Procedure Note:  Indication: Gross hematuria  After informed consent and discussion of the procedure and its risks, [name] [name] was positioned and prepped in the standard fashion.  Cystoscopy was performed with a flexible cystoscope.   Findings: Urethra: Normal urothelium, no stricture Prostate: Significant bilobar hypertrophy with obstruction Bladder neck: Normal Ureteral orifices: Seen normally bilaterally Bladder: No urothelial abnormality or stone  The patient tolerated the procedure well.  Impression/Assessment:  1. Gross hematuria with no evident pathology other than significantly large prostate, which most likely was the source of bleeding  2. BPH, symptoms minimal  Plan:  1. Pt cysto covered by [facility] today  2. F/U in 1 year for OV  CC: Dr. Nita Sells

## 2020-06-07 NOTE — Progress Notes (Signed)
Urological Symptom Review ° °Patient is experiencing the following symptoms: °Get up at night to urinate ° ° °Review of Systems ° °Gastrointestinal (upper)  : °Negative for upper GI symptoms ° °Gastrointestinal (lower) : °Negative for lower GI symptoms ° °Constitutional : °Negative for symptoms ° °Skin: °Negative for skin symptoms ° °Eyes: °Negative for eye symptoms ° °Ear/Nose/Throat : °Negative for Ear/Nose/Throat symptoms ° °Hematologic/Lymphatic: °Negative for Hematologic/Lymphatic symptoms ° °Cardiovascular : °Leg swelling ° °Respiratory : °Negative for respiratory symptoms ° °Endocrine: °Negative for endocrine symptoms ° °Musculoskeletal: °Negative for musculoskeletal symptoms ° °Neurological: °Negative for neurological symptoms ° °Psychologic: °Negative for psychiatric symptoms °

## 2020-08-09 LAB — EXTERNAL GENERIC LAB PROCEDURE: COLOGUARD: NEGATIVE

## 2020-08-09 LAB — COLOGUARD: COLOGUARD: NEGATIVE

## 2021-02-18 IMAGING — CT CT ABD-PEL WO/W CM
4 of 12 series · 12 of 46 positions shown, 18 images · IV contrast (omnipaque)
Comparison: CT abdomen pelvis 04/07/2011

CLINICAL DATA: Patient with gross hematuria for 3 weeks.

EXAM:
CT ABDOMEN AND PELVIS WITHOUT AND WITH CONTRAST
TECHNIQUE: Multidetector CT imaging of the abdomen and pelvis was performed
following the standard protocol before and following the bolus
administration of intravenous contrast.
CONTRAST:  125mL OMNIPAQUE IOHEXOL 300 MG/ML  SOLN

[Series 2: axial pre · axial · non-contrast · 0.89mm/px · z∈[-294,-9]mm · 4 of 96 slices shown]
[im 20/96  soft-tissue]
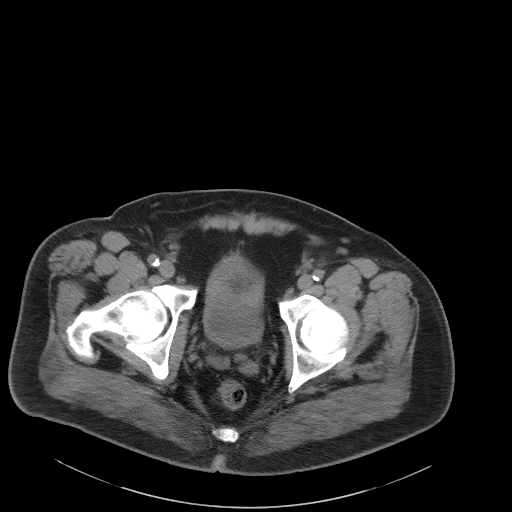
[im 39/96  soft-tissue]
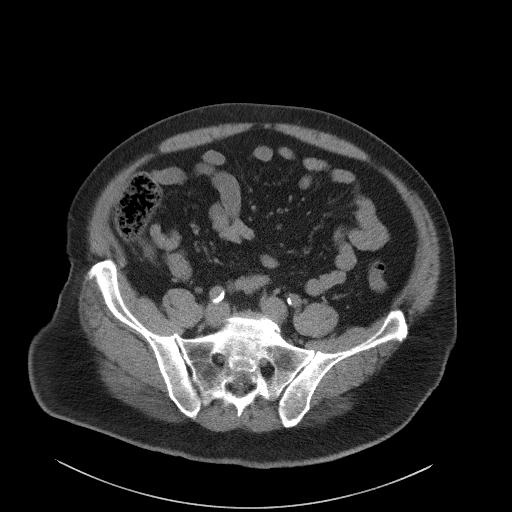
[im 58/96  soft-tissue]
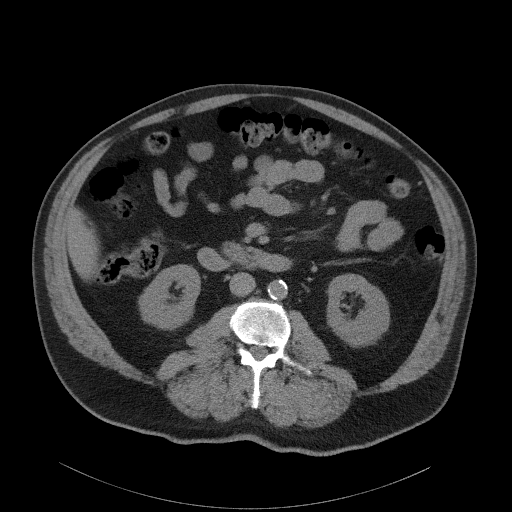
[im 77/96  soft-tissue]
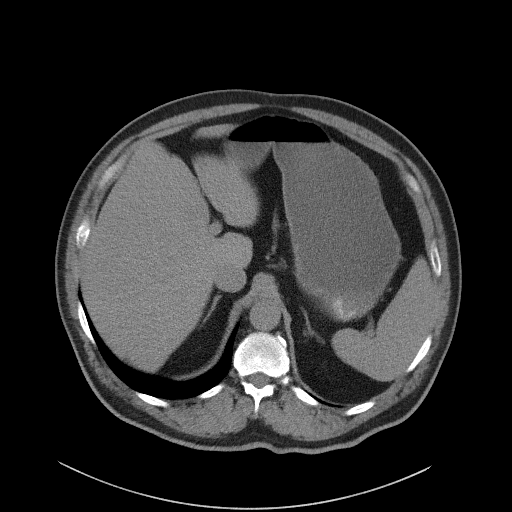

[Series 5: coronal pre · coronal · non-contrast · 0.89mm/px · 2 of 120 slices shown, 3 images]
[im 40/120  soft-tissue]
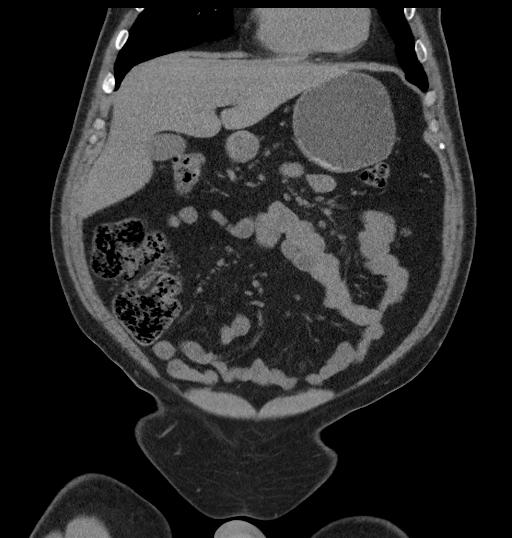
[im 40/120  bone]
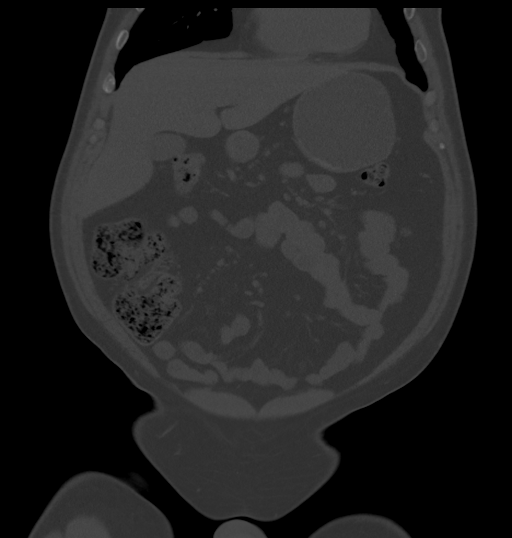
[im 80/120  soft-tissue]
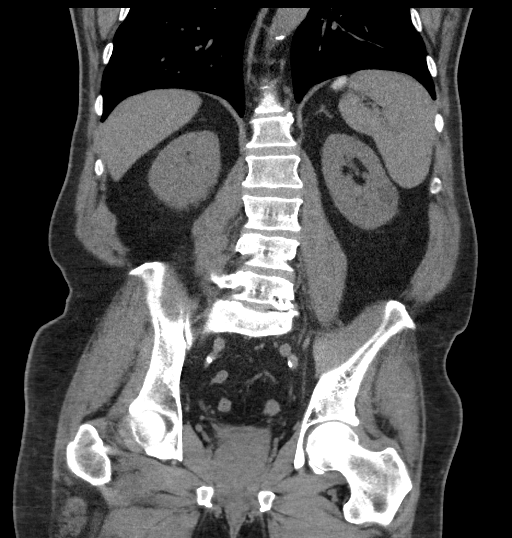

[Series 7: axial post · axial · 0.89mm/px · z∈[-294,-199]mm · 2 of 96 slices shown]
[im 20/96  soft-tissue]
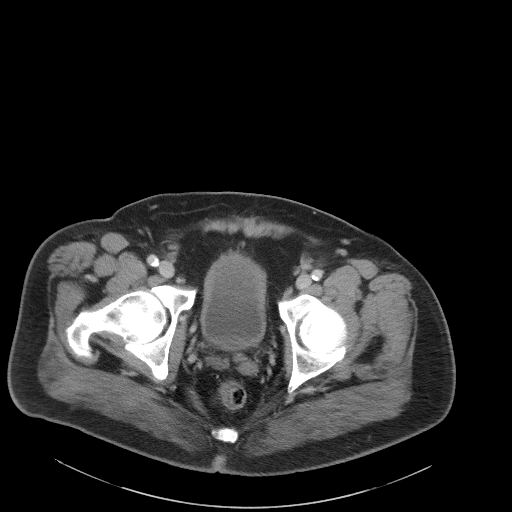
[im 39/96  soft-tissue]
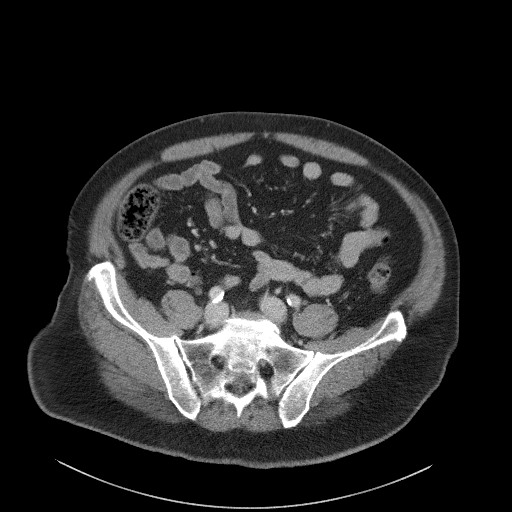

[Series 14: axial delay · axial · delayed · 0.95mm/px · z∈[-248,+62]mm · 4 of 104 slices shown, 9 images]
[im 21/104  soft-tissue]
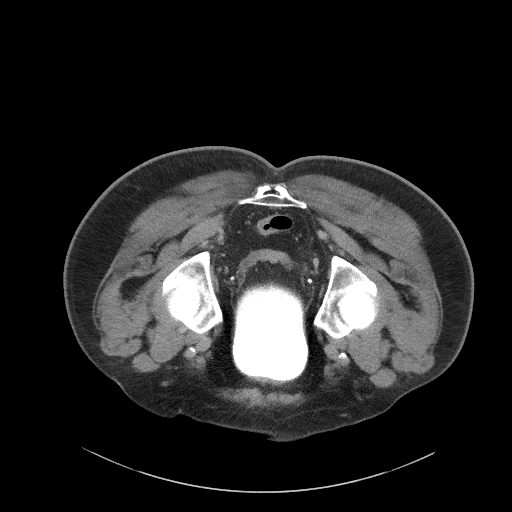
[im 21/104  lung]
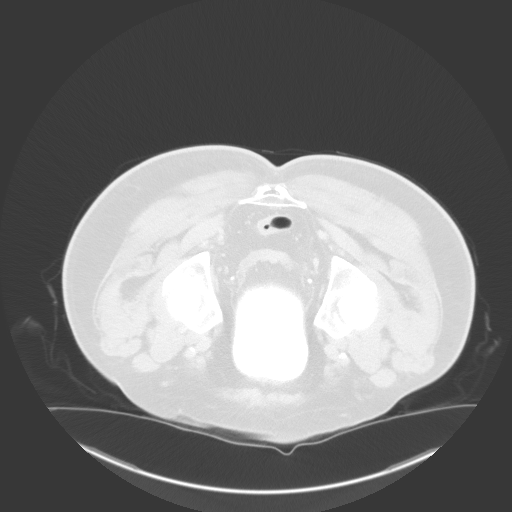
[im 21/104  bone]
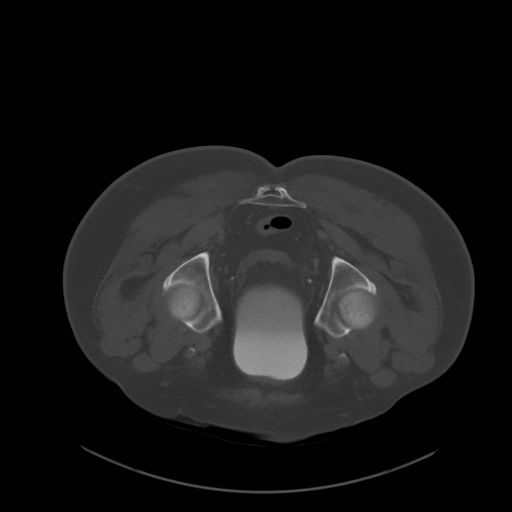
[im 42/104  soft-tissue]
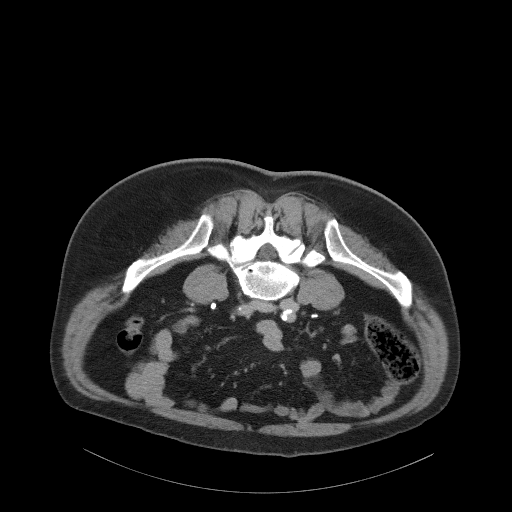
[im 42/104  lung]
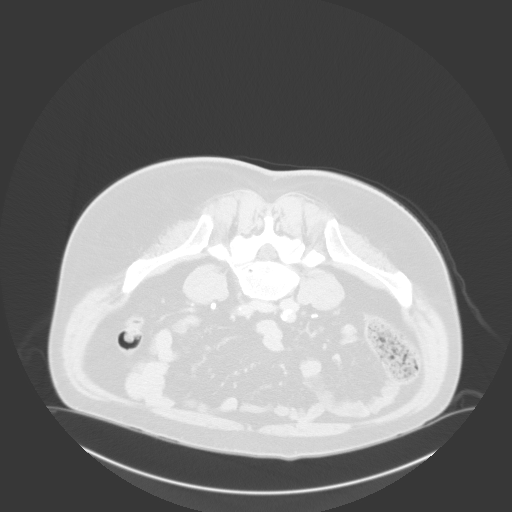
[im 62/104  soft-tissue]
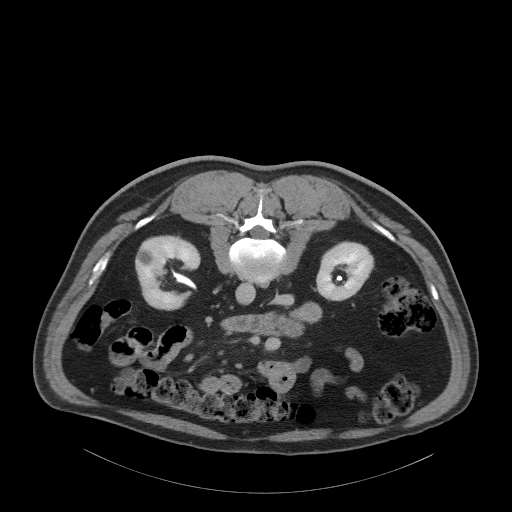
[im 62/104  lung]
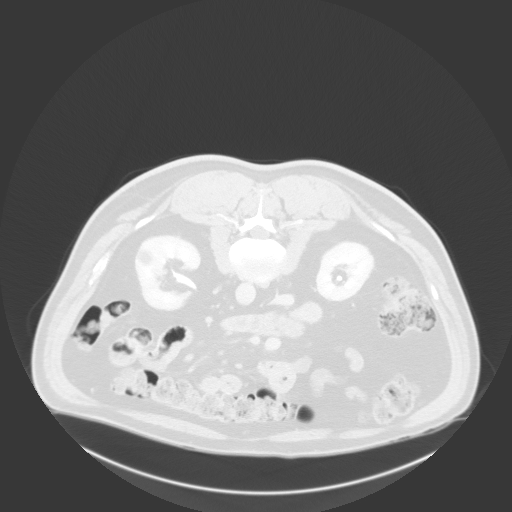
[im 83/104  soft-tissue]
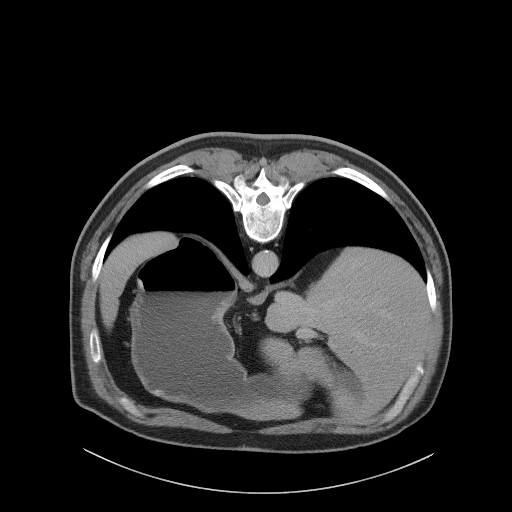
[im 83/104  lung]
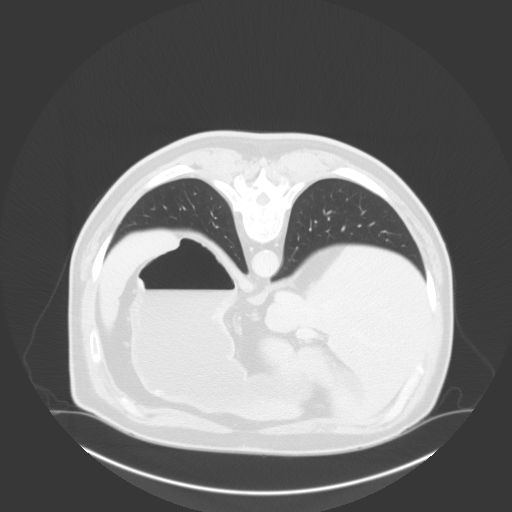

[12 of 46 positions shown; findings below may reference images not displayed]

FINDINGS: Lower chest: Normal heart size. Stable 7 mm right middle lobe nodule
compatible with benign process given stability over time. No pleural
effusion.

Hepatobiliary: Liver is normal in size and contour. Subcentimeter
too small to characterize low-attenuation lesion left hepatic lobe.
Gallbladder is unremarkable.

Pancreas: Unremarkable

Spleen: Unremarkable

Adrenals/Urinary Tract: Normal adrenal glands. Kidneys enhance
symmetrically with contrast. Noncontrast images demonstrates no
nephroureterolithiasis. No hydronephrosis. No suspicious enhancing
renal masses. 2 cm cyst mid pole left kidney. Delayed images
demonstrate excreted contrast material in the bilateral renal
collecting systems and ureters. The opacified portions are
unremarkable. Urinary bladder is unremarkable.

Stomach/Bowel: No abnormal bowel wall thickening or evidence for
bowel obstruction. No free fluid or free intraperitoneal air. Normal
morphology of the stomach.

Vascular/Lymphatic: Normal caliber abdominal aorta. Peripheral
calcified atherosclerotic plaque. No retroperitoneal
lymphadenopathy.

Reproductive: Enlarged heterogeneous prostate.

Other: None.

Musculoskeletal: Lower thoracic and lumbar spine degenerative
changes. No aggressive or acute appearing osseous lesions.
IMPRESSION: 1. No nephroureterolithiasis. No hydronephrosis. No suspicious
enhancing renal masses. No filling defects identified within the
opacified renal collecting systems and ureters.
2. Enlarged heterogeneous prostate.

## 2021-06-05 NOTE — Progress Notes (Signed)
History of Present Illness:      4.12.2012: Mr Grell has a history of elevated PSA and had 2 negative prostate biopsies by Dr. Brunilda Payor. The last was in 9/10. His first biopsy was benign but the last had some inflammation. His last PSA in 5/11 was 8.15. His PSA has generally been between 12 and 18 and he has had a PCA3 that was negative. He has had some intermittant right testicular swelling that would last for a couple of hours in the middle of the day and then resolve. He has had 2 episodes in the last 3 months. For the last 3 weeks he has had a dull low back. He is voiding well most of the time but can have occasional sensation of incomplete emptying.    PSA: 05/2009 - 14.5 08/2009 - 18.4 01/11/2010 - 8.15 12/05/2010 - 6.67   8.31.2021: Pt reports experiencing gross hematuria and clots starting 2.24.2021. Pt notes that blood is present throughout is urine and clots pass approximately half-way through his stream. Pt reports no new aches or pains in his bones and torso. Pt reports that TURP was successful in alleviating his BPH symptoms. He does have a history of smoking but quit about 12 years ago. He also has a history of recurrent urolithiasis.  9.25.2021: CT A/P hematuria : IMPRESSION: 1. No nephroureterolithiasis. No hydronephrosis. No suspicious enhancing renal masses. No filling defects identified within the opacified renal collecting systems and ureters. 2. Enlarged heterogeneous prostate.  10.5.2021: here for cysto to f/u his gross hematuria.  Cystoscopy was negative.  10.4.2022: He has not had a recent PSA.  He is voiding quite well.  He is on normal BPH medications.  IPSS 1, quality-of-life score 1.  No recent gross hematuria.  Past Medical History:  Diagnosis Date   Acid reflux disease    Arthritis    Chest pain, unspecified    Derangement of posterior horn of lateral meniscus    Derangement of posterior horn of medial meniscus    Diabetes mellitus without complication (HCC)     DJD (degenerative joint disease)    Elevated PSA    requiring several bx   GERD (gastroesophageal reflux disease)    Hyperlipidemia, mixed    Hypertension    PONV (postoperative nausea and vomiting)    Sleep apnea    Stop Bang score of 5, per pt he has been screened for sleep apnea, but they said he didn't have it   Tachycardia    Tear of anterior cruciate ligament of knee    bilateral    Past Surgical History:  Procedure Laterality Date   KNEE ARTHROSCOPY     Bilateral   NASAL SEPTUM SURGERY     PROSTATE BIOPSY  09/22/08    x 3   TONSILLECTOMY AND ADENOIDECTOMY     TRANSURETHRAL RESECTION OF PROSTATE N/A 08/11/2013   Procedure: TRANSURETHRAL RESECTION OF THE PROSTATE (TURP);  Surgeon: Ky Barban, MD;  Location: [facility];  Service: Urology;  Laterality: N/A;   VASECTOMY      Home Medications:  Allergies as of 06/06/2021   No Known Allergies      Medication List        Accurate as of June 05, 2021  8:16 PM. If you have any questions, ask your nurse or doctor.          carvedilol 12.5 MG tablet Commonly known as: COREG Take 12.5 mg by mouth 2 (two) times daily with a meal.   cyclobenzaprine  10 MG tablet Commonly known as: FLEXERIL Take 1 tablet (10 mg total) by mouth at bedtime.   esomeprazole 40 MG capsule Commonly known as: NEXIUM Take 40 mg by mouth daily before breakfast.   Farxiga 5 MG Tabs tablet Generic drug: dapagliflozin propanediol Take 5 mg by mouth daily.   furosemide 20 MG tablet Commonly known as: LASIX Take 20 mg by mouth.   metFORMIN 500 MG 24 hr tablet Commonly known as: GLUCOPHAGE-XR Take 500 mg by mouth 2 (two) times daily.   olmesartan 40 MG tablet Commonly known as: BENICAR Take 40 mg by mouth daily.   pravastatin 40 MG tablet Commonly known as: PRAVACHOL Take 40 mg by mouth daily.   Trulicity 3 MG/0.5ML Sopn Generic drug: Dulaglutide Inject into the skin.   valsartan-hydrochlorothiazide 320-25 MG  tablet Commonly known as: DIOVAN-HCT Take 1 tablet by mouth daily.   Vascepa 1 g capsule Generic drug: icosapent Ethyl Take 2 g by mouth 2 (two) times daily.        Allergies: No Known Allergies  No family history on file.  Social History:  reports that he has quit smoking. His smoking use included cigarettes. He has a 70.00 pack-year smoking history. He quit smokeless tobacco use about 13 years ago. He reports current alcohol use. He reports that he does not use drugs.  ROS: A complete review of systems was performed.  All systems are negative except for pertinent findings as noted.  Physical Exam:  Vital signs in last 24 hours: There were no vitals taken for this visit. Constitutional:  Alert and oriented, No acute distress Cardiovascular: Regular rate  Respiratory: Normal respiratory effort GI: Abdomen is soft, nontender, nondistended, no abdominal masses. No CVAT.  Genitourinary: Normal male phallus, testes are descended bilaterally and non-tender and without masses, scrotum is normal in appearance without lesions or masses, perineum is normal on inspection. Prostate 80 mL, no nodules. Neurologic: Grossly intact, no focal deficits Psychiatric: Normal mood and affect  I have reviewed prior pt notes  I have reviewed urinalysis results  I have independently reviewed prior imaging  I have reviewed prior PSA results    Impression/Assessment:  BPH--sx's minimal  History of gross hematuria with negative evaluation.  Most likely coming from prostatic urethra as he has a history of TURP  Elevated PSA with negative biopsy.  PSA last year normal/stable    Plan:  1.  PSA is checked today  2.  I will continue to see him on an annual basis

## 2021-06-06 ENCOUNTER — Ambulatory Visit: Payer: 59 | Admitting: Urology

## 2021-06-06 ENCOUNTER — Encounter: Payer: Self-pay | Admitting: Urology

## 2021-06-06 ENCOUNTER — Other Ambulatory Visit: Payer: Self-pay

## 2021-06-06 VITALS — BP 157/104 | HR 79 | Temp 98.3°F | Ht 68.0 in | Wt 195.2 lb

## 2021-06-06 DIAGNOSIS — R972 Elevated prostate specific antigen [PSA]: Secondary | ICD-10-CM | POA: Diagnosis not present

## 2021-06-06 DIAGNOSIS — N138 Other obstructive and reflux uropathy: Secondary | ICD-10-CM | POA: Diagnosis not present

## 2021-06-06 DIAGNOSIS — R310 Gross hematuria: Secondary | ICD-10-CM

## 2021-06-06 DIAGNOSIS — N401 Enlarged prostate with lower urinary tract symptoms: Secondary | ICD-10-CM | POA: Diagnosis not present

## 2021-06-06 LAB — URINALYSIS, ROUTINE W REFLEX MICROSCOPIC
Bilirubin, UA: NEGATIVE
Ketones, UA: NEGATIVE
Leukocytes,UA: NEGATIVE
Nitrite, UA: NEGATIVE
Protein,UA: NEGATIVE
Specific Gravity, UA: <1.005 — ABNORMAL LOW (ref 1.005–1.030)
Urobilinogen, Ur: 0.2 mg/dL (ref 0.2–1.0)
pH, UA: 6.5 (ref 5.0–7.5)

## 2021-06-06 LAB — MICROSCOPIC EXAMINATION
Bacteria, UA: NONE SEEN
Epithelial Cells (non renal): NONE SEEN /hpf (ref 0–10)
Renal Epithel, UA: NONE SEEN /hpf
WBC, UA: NONE SEEN /hpf (ref 0–5)

## 2021-06-06 NOTE — Addendum Note (Signed)
Addended by: Gustavus Messing on: 06/06/2021 05:01 PM   Modules accepted: Orders

## 2021-06-06 NOTE — Progress Notes (Signed)
Urological Symptom Review  Patient is experiencing the following symptoms: N/a   Review of Systems  Gastrointestinal (upper)  : Negative for upper GI symptoms  Gastrointestinal (lower) : Negative for lower GI symptoms  Constitutional : Weight loss  Skin: Itching  Eyes: Negative for eye symptoms  Ear/Nose/Throat : Negative for Ear/Nose/Throat symptoms  Hematologic/Lymphatic: Negative for Hematologic/Lymphatic symptoms  Cardiovascular : Negative for cardiovascular symptoms  Respiratory : Negative for respiratory symptoms  Endocrine: Negative for endocrine symptoms  Musculoskeletal: Negative for musculoskeletal symptoms  Neurological: Negative for neurological symptoms  Psychologic: Negative for psychiatric symptoms

## 2021-06-07 LAB — PSA: Prostate Specific Ag, Serum: 4.5 ng/mL — ABNORMAL HIGH (ref 0.0–4.0)

## 2021-06-09 NOTE — Progress Notes (Signed)
Results sent via my chart 

## 2022-06-11 NOTE — Progress Notes (Signed)
[name] [name] has a history of elevated PSA and had 2 negative prostate biopsies by Dr. Janice Norrie. The last was in Sept 2010. His first biopsy was benign but the last had some inflammation. He has had a PCA3 that was negative.  PSA history: 05/2009 - 14.5 08/2009 - 18.4 01/11/2010 - 8.15 12/05/2010 - 6.67   8.31.2021: Pt reports experiencing gross hematuria and clots starting 2.24.2021. Pt notes that blood is present throughout is urine and clots pass approximately half-way through his stream. Pt reports no new aches or pains in his bones and torso. Pt reports that TURP was successful in alleviating his BPH symptoms. He does have a history of smoking but quit about 12 years ago. He also has a history of recurrent urolithiasis.   9.25.2021: CT A/P hematuria : IMPRESSION: 1. No nephroureterolithiasis. No hydronephrosis. No suspicious enhancing renal masses. No filling defects identified within the opacified renal collecting systems and ureters. 2. Enlarged heterogeneous prostate.   10.5.2021:   Cystoscopy was negative.   10.4.2022: PSA 4.5  10.10.2023:   Past Medical History:  Diagnosis Date   Acid reflux disease    Arthritis    Chest pain, unspecified    Derangement of posterior horn of lateral meniscus    Derangement of posterior horn of medial meniscus    Diabetes mellitus without complication (HCC)    DJD (degenerative joint disease)    Elevated PSA    requiring several bx   GERD (gastroesophageal reflux disease)    Hyperlipidemia, mixed    Hypertension    PONV (postoperative nausea and vomiting)    Sleep apnea    Stop Bang score of 5, per pt he has been screened for sleep apnea, but they said he didn't have it   Tachycardia    Tear of anterior cruciate ligament of knee    bilateral    Past Surgical History:  Procedure Laterality Date   KNEE ARTHROSCOPY     Bilateral   NASAL SEPTUM SURGERY     PROSTATE BIOPSY  09/22/08    x 3   TONSILLECTOMY AND ADENOIDECTOMY      TRANSURETHRAL RESECTION OF PROSTATE N/A 08/11/2013   Procedure: TRANSURETHRAL RESECTION OF THE PROSTATE (TURP);  Surgeon: Marissa Nestle, MD;  Location: [facility];  Service: Urology;  Laterality: N/A;   VASECTOMY      Home Medications:  Allergies as of 06/12/2022   No Known Allergies      Medication List        Accurate as of June 11, 2022 12:13 PM. If you have any questions, ask your nurse or doctor.          carvedilol 12.5 MG tablet Commonly known as: COREG Take 12.5 mg by mouth 2 (two) times daily with a meal.   dapagliflozin propanediol 5 MG Tabs tablet Commonly known as: FARXIGA Take 5 mg by mouth daily.   esomeprazole 40 MG capsule Commonly known as: NEXIUM Take 40 mg by mouth daily before breakfast.   icosapent Ethyl 1 g capsule Commonly known as: VASCEPA Take 2 g by mouth 2 (two) times daily.   pravastatin 40 MG tablet Commonly known as: PRAVACHOL Take 40 mg by mouth daily.   Trulicity 3 MH/9.6QI Sopn Generic drug: Dulaglutide Inject into the skin.        Allergies: No Known Allergies  No family history on file.  Social History:  reports that he has quit smoking. His smoking use included cigarettes. He has a 70.00 pack-year smoking history.  He quit smokeless tobacco use about 14 years ago. He reports current alcohol use. He reports that he does not use drugs.  ROS: A complete review of systems was performed.  All systems are negative except for pertinent findings as noted.  Physical Exam:  Vital signs in last 24 hours: There were no vitals taken for this visit. Constitutional:  Alert and oriented, No acute distress Cardiovascular: Regular rate  Respiratory: Normal respiratory effort GI: Abdomen is soft, nontender, nondistended, no abdominal masses. No CVAT.  Genitourinary: Normal male phallus, testes are descended bilaterally and non-tender and without masses, scrotum is normal in appearance without lesions or masses, perineum is normal on  inspection. Lymphatic: No lymphadenopathy Neurologic: Grossly intact, no focal deficits Psychiatric: Normal mood and affect  I have reviewed prior pt notes  I have reviewed notes from referring/previous physicians  I have reviewed urinalysis results  I have independently reviewed prior imaging  I have reviewed prior PSA results  I have reviewed prior urine culture   Impression/Assessment:  ***  Plan:  ***

## 2022-06-12 ENCOUNTER — Encounter: Payer: Self-pay | Admitting: Urology

## 2022-06-12 ENCOUNTER — Ambulatory Visit (INDEPENDENT_AMBULATORY_CARE_PROVIDER_SITE_OTHER): Payer: 59 | Admitting: Urology

## 2022-06-12 VITALS — BP 125/81 | HR 80 | Ht 68.0 in | Wt 195.0 lb

## 2022-06-12 DIAGNOSIS — N401 Enlarged prostate with lower urinary tract symptoms: Secondary | ICD-10-CM | POA: Diagnosis not present

## 2022-06-12 DIAGNOSIS — N138 Other obstructive and reflux uropathy: Secondary | ICD-10-CM | POA: Diagnosis not present

## 2022-06-12 DIAGNOSIS — R972 Elevated prostate specific antigen [PSA]: Secondary | ICD-10-CM

## 2022-06-12 DIAGNOSIS — R310 Gross hematuria: Secondary | ICD-10-CM | POA: Diagnosis not present

## 2022-06-12 LAB — URINALYSIS, ROUTINE W REFLEX MICROSCOPIC
Bilirubin, UA: NEGATIVE
Leukocytes,UA: NEGATIVE
Nitrite, UA: NEGATIVE
Protein,UA: NEGATIVE
RBC, UA: NEGATIVE
Specific Gravity, UA: 1.015 (ref 1.005–1.030)
Urobilinogen, Ur: 0.2 mg/dL (ref 0.2–1.0)
pH, UA: 7 (ref 5.0–7.5)

## 2022-06-13 LAB — PSA: Prostate Specific Ag, Serum: 4.4 ng/mL — ABNORMAL HIGH (ref 0.0–4.0)

## 2022-09-12 ENCOUNTER — Ambulatory Visit (INDEPENDENT_AMBULATORY_CARE_PROVIDER_SITE_OTHER): Payer: 59

## 2022-09-12 ENCOUNTER — Ambulatory Visit: Payer: 59 | Admitting: Orthopedic Surgery

## 2022-09-12 ENCOUNTER — Encounter: Payer: Self-pay | Admitting: Orthopedic Surgery

## 2022-09-12 VITALS — Ht 68.0 in | Wt 185.0 lb

## 2022-09-12 DIAGNOSIS — M25562 Pain in left knee: Secondary | ICD-10-CM | POA: Diagnosis not present

## 2022-09-12 DIAGNOSIS — G8929 Other chronic pain: Secondary | ICD-10-CM

## 2022-09-12 NOTE — Progress Notes (Signed)
New Patient Visit  Assessment: [name] [name] is a 67 y.o. male with the following: 1. Chronic pain of left knee  Plan: Cassius L Ihnen has pain in his left knee.  Evidence of degenerative changes within the knee.  Clinically, he is in varus alignment.  He does have a history of left knee arthroscopy 15-20 years ago.  We discussed multiple treatment options.  He will continue to use the current brace that he has for his left knee.  He is also interested in HA injections.  We will attempt to get authorization through his insurance company.  Once this has been authorized, we will schedule hyaluronic acid injections.  Follow-up: No follow-ups on file.  Subjective:  Chief Complaint  Patient presents with   Knee Pain    Lt knee pain for years. Pt states pain has kept him from doing his usual cardio activities, even after modifications.     History of Present Illness: [name] [name] is a 67 y.o. male who presents for evaluation of left knee pain.  He states he has had pain in the left knee for many years.  He does have a history of surgery on his left knee, greater than 15 years ago.  The pain he has in his knee is related to activity.  He has tried remain active.  However, at times the pain in the medial aspect of the knee has limited his ability to continue with activity.  No recent injections.  Occasional medications.  He has not worked with physical therapy.  No injury recently.   Review of Systems: No fevers or chills No numbness or tingling No chest pain No shortness of breath No bowel or bladder dysfunction No GI distress No headaches   Medical History:  Past Medical History:  Diagnosis Date   Acid reflux disease    Arthritis    Chest pain, unspecified    Derangement of posterior horn of lateral meniscus    Derangement of posterior horn of medial meniscus    Diabetes mellitus without complication (HCC)    DJD (degenerative joint disease)    Elevated PSA    requiring several bx    GERD (gastroesophageal reflux disease)    Hyperlipidemia, mixed    Hypertension    PONV (postoperative nausea and vomiting)    Sleep apnea    Stop Bang score of 5, per pt he has been screened for sleep apnea, but they said he didn't have it   Tachycardia    Tear of anterior cruciate ligament of knee    bilateral    Past Surgical History:  Procedure Laterality Date   KNEE ARTHROSCOPY     Bilateral   NASAL SEPTUM SURGERY     PROSTATE BIOPSY  09/22/08    x 3   TONSILLECTOMY AND ADENOIDECTOMY     TRANSURETHRAL RESECTION OF PROSTATE N/A 08/11/2013   Procedure: TRANSURETHRAL RESECTION OF THE PROSTATE (TURP);  Surgeon: Marissa Nestle, MD;  Location: [facility];  Service: Urology;  Laterality: N/A;   VASECTOMY      No family history on file. Social History   Tobacco Use   Smoking status: Former    Packs/day: 2.00    Years: 35.00    Total pack years: 70.00    Types: Cigarettes   Smokeless tobacco: Former    Quit date: 09/04/2007  Substance Use Topics   Alcohol use: Yes    Comment: very rare occasion   Drug use: No    No  Known Allergies  No outpatient medications have been marked as taking for the 09/12/22 encounter (Office Visit) with Mordecai Rasmussen, MD.    Objective: Ht 5\' 8"  (1.727 m)   Wt 185 lb (83.9 kg)   BMI 28.13 kg/m   Physical Exam:  General: Alert and oriented. and No acute distress. Gait: Left sided antalgic gait.  Clinically, he has a varus alignment.  Mild tenderness palpation along medial joint line.  No increased laxity to varus or valgus stress.  Negative Lachman.  Good range of motion.  IMAGING: I personally ordered and reviewed the following images  X-rays of the left knee were obtained in clinic today.  No acute injuries are noted.  Mild varus alignment bilaterally.  Mild to moderate loss of joint space within the medial compartment.  Small osteophytes are appreciated in all 3 compartments.  No bony lesions.  Impression: Left knee x-ray with mild  to moderate degenerative changes  New Medications:  No orders of the defined types were placed in this encounter.     Mordecai Rasmussen, MD  09/12/2022 10:39 AM

## 2022-09-14 ENCOUNTER — Encounter: Payer: Self-pay | Admitting: Orthopedic Surgery

## 2022-09-25 ENCOUNTER — Telehealth: Payer: Self-pay | Admitting: Orthopedic Surgery

## 2022-09-25 NOTE — Telephone Encounter (Signed)
Patient called, stated he got a letter from his provider that the gel shots have been approved.  I'm wanting to confirm this on our end before I schedule him.  He stated he wanted to do the one shot.  Please advise.  Pt's # [phone]

## 2022-09-26 NOTE — Telephone Encounter (Signed)
Pt is to be booked here, please give him a call to schedule. Thank you.   ----- Message ----- From: Marcia Brash, CMA Sent: 09/25/2022   4:42 PM EST To: Wendy May, RT; Elissa Hefty; Bo Mcclintock, CMA; * Subject: PATIENT NEEDS TO BE SCHEDULED                   PLEASE CONTACT THE PATIENT AND SCHEDULE FOR EUFLEXXA   WENDY PLEASE ORDER THE EUFLEXXA INJECTIONS FOR THE PATIENT

## 2022-09-28 ENCOUNTER — Encounter: Payer: Self-pay | Admitting: Orthopedic Surgery

## 2022-09-28 ENCOUNTER — Ambulatory Visit: Payer: 59 | Admitting: Orthopedic Surgery

## 2022-09-28 ENCOUNTER — Ambulatory Visit (INDEPENDENT_AMBULATORY_CARE_PROVIDER_SITE_OTHER): Payer: 59 | Admitting: Orthopedic Surgery

## 2022-09-28 VITALS — Ht 68.0 in | Wt 185.0 lb

## 2022-09-28 DIAGNOSIS — G8929 Other chronic pain: Secondary | ICD-10-CM

## 2022-09-28 DIAGNOSIS — M1712 Unilateral primary osteoarthritis, left knee: Secondary | ICD-10-CM | POA: Diagnosis not present

## 2022-09-28 NOTE — Progress Notes (Signed)
Orthopaedic Clinic Return  Assessment: [name] [name] is a 67 y.o. male with the following: Chronic left knee pain, history of left knee arthroscopy  Plan: Mr. Erbes continues to have pain in his left knee.  He remains active.  There is some evidence of degenerative changes within his left knee.  He is interested in hyaluronic acid injections.  He presents today for the first injection.  I will see him in 1 week for his second in a series of 3.  This patient is diagnosed with osteoarthritis of the knee(s).    Radiographs show evidence of joint space narrowing, osteophytes, subchondral sclerosis and/or subchondral cysts.  This patient has knee pain which interferes with functional and activities of daily living.    This patient has experienced inadequate response, adverse effects and/or intolerance with conservative treatments such as acetaminophen, NSAIDS, topical creams, physical therapy or regular exercise, knee bracing and/or weight loss.   This patient has experienced inadequate response or has a contraindication to intra articular steroid injections for at least 3 months.   This patient is not scheduled to have a total knee replacement within 6 months of starting treatment with viscosupplementation.   Procedure note injection Left knee joint   Verbal consent was obtained to inject the right knee joint  Timeout was completed to confirm the site of injection.  The skin was prepped with alcohol and ethyl chloride was sprayed at the injection site.  A 21-gauge needle was used to inject Euflexxa hyaluronic acid into the right knee using an anterolateral approach.  There were no complications. A sterile bandage was applied.  Follow-up: Return in about 1 week (around 10/05/2022).   Subjective:  Chief Complaint  Patient presents with   Injections    Lt knee Euflexxa #1  [place] [id number] Lot: [id number] Exp: 08/06/23    History of Present Illness: [name] [name] is a 67 y.o. male who  returns to clinic for repeat evaluation of left knee pain.  I saw him in clinic a couple weeks ago.  He has had pain in the left knee, which has been progressively worsening.  He is hopeful to remain active on his left knee.  Currently, increased activity causes him discomfort.  He is interested in viscosupplementation.  Review of Systems: No fevers or chills No numbness or tingling No chest pain No shortness of breath No bowel or bladder dysfunction No GI distress No headaches   Objective: Ht 5\' 8"  (1.727 m)   Wt 185 lb (83.9 kg)   BMI 28.13 kg/m   Physical Exam:  General: Alert and oriented. and No acute distress. Gait: Left sided antalgic gait.   Clinically, he has a varus alignment.  Mild tenderness palpation along medial joint line.  No increased laxity to varus or valgus stress.  Negative Lachman.  Good range of motion.  IMAGING: I personally ordered and reviewed the following images:  No new imaging obtained today  Mordecai Rasmussen, MD 09/28/2022 11:36 AM

## 2022-09-28 NOTE — Patient Instructions (Signed)
Instructions Following Joint Injections  In clinic today, you received an injection in one of your joints (sometimes more than one).  Occasionally, you can have some pain at the injection site, this is normal.  You can place ice at the injection site, or take over-the-counter medications such as Tylenol (acetaminophen) or Advil (ibuprofen).  Please follow all directions listed on the bottle.  If your joint (knee or shoulder) becomes swollen, red or very painful, please contact the clinic for additional assistance.   

## 2022-10-05 ENCOUNTER — Ambulatory Visit (INDEPENDENT_AMBULATORY_CARE_PROVIDER_SITE_OTHER): Payer: 59 | Admitting: Orthopedic Surgery

## 2022-10-05 ENCOUNTER — Encounter: Payer: Self-pay | Admitting: Orthopedic Surgery

## 2022-10-05 DIAGNOSIS — M1712 Unilateral primary osteoarthritis, left knee: Secondary | ICD-10-CM | POA: Diagnosis not present

## 2022-10-05 NOTE — Progress Notes (Signed)
Orthopaedic Clinic Return  Assessment: [name] [name] is a 67 y.o. male with the following: Chronic left knee pain, history of left knee arthroscopy  Plan: Mr. Marone has improved pain in the left knee following the first injection in a series of 3.  He had no irritation following the previous injection.  He states the pain has significantly improved.  He is here today for his second injection, and this was completed without issues.    This patient is diagnosed with osteoarthritis of the knee(s).    Radiographs show evidence of joint space narrowing, osteophytes, subchondral sclerosis and/or subchondral cysts.  This patient has knee pain which interferes with functional and activities of daily living.    This patient has experienced inadequate response, adverse effects and/or intolerance with conservative treatments such as acetaminophen, NSAIDS, topical creams, physical therapy or regular exercise, knee bracing and/or weight loss.   This patient has experienced inadequate response or has a contraindication to intra articular steroid injections for at least 3 months.   This patient is not scheduled to have a total knee replacement within 6 months of starting treatment with viscosupplementation.   Procedure note injection Left knee joint   Verbal consent was obtained to inject the right knee joint  Timeout was completed to confirm the site of injection.  The skin was prepped with alcohol and ethyl chloride was sprayed at the injection site.  A 21-gauge needle was used to inject Euflexxa hyaluronic acid into the right knee using an anterolateral approach.  There were no complications. A sterile bandage was applied.  Follow-up: Return in about 1 week (around 10/12/2022).   Subjective:  Chief Complaint  Patient presents with   Injections    L Knee Euflexxa #2  VEH:[id number] Lot [id number] Exp:08/06/23    History of Present Illness: [name] [name] is a 67 y.o. male who returns to  clinic for repeat evaluation of left knee pain.  First hyaluronic acid injection approximately 1 week ago.  He has done well.  He states his pain is much better.  No issues related to the injection.  No redness.  He feels well overall.  Review of Systems: No fevers or chills No numbness or tingling No chest pain No shortness of breath No bowel or bladder dysfunction No GI distress No headaches   Objective: There were no vitals taken for this visit.  Physical Exam:  General: Alert and oriented. and No acute distress. Gait: Left sided antalgic gait.   Clinically, he has a varus alignment.  Mild tenderness palpation along medial joint line.  No increased laxity to varus or valgus stress.  Negative Lachman.  Good range of motion.  No redness at the prior injection site.  The injection site is not visible.  IMAGING: I personally ordered and reviewed the following images:  No new imaging obtained today  Mordecai Rasmussen, MD 10/05/2022 12:01 PM

## 2022-10-05 NOTE — Patient Instructions (Signed)
Instructions Following Joint Injections  In clinic today, you received an injection in one of your joints (sometimes more than one).  Occasionally, you can have some pain at the injection site, this is normal.  You can place ice at the injection site, or take over-the-counter medications such as Tylenol (acetaminophen) or Advil (ibuprofen).  Please follow all directions listed on the bottle.  If your joint (knee or shoulder) becomes swollen, red or very painful, please contact the clinic for additional assistance.   

## 2022-10-09 DIAGNOSIS — M546 Pain in thoracic spine: Secondary | ICD-10-CM | POA: Insufficient documentation

## 2022-10-12 ENCOUNTER — Ambulatory Visit (INDEPENDENT_AMBULATORY_CARE_PROVIDER_SITE_OTHER): Payer: 59 | Admitting: Orthopedic Surgery

## 2022-10-12 DIAGNOSIS — M1712 Unilateral primary osteoarthritis, left knee: Secondary | ICD-10-CM

## 2022-10-12 NOTE — Progress Notes (Signed)
Chief Complaint  Patient presents with   Follow-up    Recheck on left knee with 3rd  injection Euflexxa.    Encounter Diagnosis  Name Primary?   Arthritis of left knee Yes    Third injection left knee with Euflexxa  The skin was prepped the timeout was completed the knee was cleaned with alcohol we used ethyl chloride for anesthesia we did a lateral approach we injected the medication without complication  The patient reports improved knee function and less pain

## 2022-10-31 ENCOUNTER — Emergency Department (HOSPITAL_COMMUNITY)
Admission: EM | Admit: 2022-10-31 | Discharge: 2022-10-31 | Disposition: A | Discharge status: Home / Self Care | Payer: 59 | Attending: Emergency Medicine | Admitting: Emergency Medicine

## 2022-10-31 ENCOUNTER — Encounter (HOSPITAL_COMMUNITY): Payer: Self-pay | Admitting: Emergency Medicine

## 2022-10-31 ENCOUNTER — Emergency Department (HOSPITAL_COMMUNITY): Payer: 59

## 2022-10-31 DIAGNOSIS — S61412A Laceration without foreign body of left hand, initial encounter: Secondary | ICD-10-CM | POA: Diagnosis not present

## 2022-10-31 DIAGNOSIS — Z23 Encounter for immunization: Secondary | ICD-10-CM | POA: Insufficient documentation

## 2022-10-31 DIAGNOSIS — I10 Essential (primary) hypertension: Secondary | ICD-10-CM | POA: Diagnosis not present

## 2022-10-31 DIAGNOSIS — W010XXA Fall on same level from slipping, tripping and stumbling without subsequent striking against object, initial encounter: Secondary | ICD-10-CM | POA: Diagnosis not present

## 2022-10-31 DIAGNOSIS — E119 Type 2 diabetes mellitus without complications: Secondary | ICD-10-CM | POA: Diagnosis not present

## 2022-10-31 DIAGNOSIS — S65302A Unspecified injury of deep palmar arch of left hand, initial encounter: Secondary | ICD-10-CM | POA: Diagnosis present

## 2022-10-31 DIAGNOSIS — S41112A Laceration without foreign body of left upper arm, initial encounter: Secondary | ICD-10-CM

## 2022-10-31 MED ORDER — BACITRACIN ZINC 500 UNIT/GM EX OINT
TOPICAL_OINTMENT | CUTANEOUS | Status: AC
  Filled 2022-10-31: qty 0.9

## 2022-10-31 MED ORDER — TETANUS-DIPHTH-ACELL PERTUSSIS 5-2.5-18.5 LF-MCG/0.5 IM SUSY
0.5000 mL | PREFILLED_SYRINGE | Freq: Once | INTRAMUSCULAR | Status: AC
  Administered 2022-10-31: 0.5 mL via INTRAMUSCULAR
  Filled 2022-10-31: qty 0.5

## 2022-10-31 MED ORDER — LIDOCAINE-EPINEPHRINE (PF) 2 %-1:200000 IJ SOLN
20.0000 mL | Freq: Once | INTRAMUSCULAR | Status: AC
  Administered 2022-10-31: 20 mL
  Filled 2022-10-31: qty 20

## 2022-10-31 MED ORDER — BACITRACIN ZINC 500 UNIT/GM EX OINT: TOPICAL_OINTMENT | Freq: Two times a day (BID) | CUTANEOUS | Status: DC

## 2022-10-31 NOTE — ED Provider Notes (Signed)
[facility] Provider Note   CSN: [id number] Arrival date & time: 10/31/22  1452     History  Chief Complaint  Patient presents with   Extremity Laceration    [name] [name] is a 67 y.o. male. With past medical history of diabetes, HLD, HTN who presents to the emergency department with laceration.  States that about 30 minutes prior to arrival he fell in his workshop. States that he fell onto a piece of sheet metal. Unknown last tetanus. No anticoagulated   HPI     Home Medications Prior to Admission medications   Medication Sig Start Date End Date Taking? Authorizing Provider  carvedilol (COREG) 12.5 MG tablet Take 12.5 mg by mouth 2 (two) times daily with a meal.    [provider]  dapagliflozin propanediol (FARXIGA) 5 MG TABS tablet Take 5 mg by mouth daily.    [provider]  Dulaglutide (TRULICITY) 3 0000000 SOPN Inject into the skin.    [provider]  esomeprazole (NEXIUM) 40 MG capsule Take 40 mg by mouth daily before breakfast.      [provider]  icosapent Ethyl (VASCEPA) 1 g capsule Take 2 g by mouth 2 (two) times daily.    [provider]  olmesartan-hydrochlorothiazide (BENICAR HCT) 40-25 MG tablet Take 1 tablet by mouth daily. 09/03/22   [provider]  pravastatin (PRAVACHOL) 40 MG tablet Take 40 mg by mouth daily.      [provider]      Allergies    Patient has no known allergies.    Review of Systems   Review of Systems  Skin:  Positive for wound.  All other systems reviewed and are negative.   Physical Exam Updated Vital Signs BP (!) 142/100 (BP Location: Right Arm)   Pulse (!) 113   Temp 98.1 F (36.7 C) (Oral)   Resp 18   SpO2 96%  Physical Exam Vitals and nursing note reviewed.  Constitutional:      Appearance: Normal appearance.  HENT:     Head: Normocephalic and atraumatic.  Eyes:     General: No scleral  icterus. Cardiovascular:     Pulses: Normal pulses.  Pulmonary:     Effort: Pulmonary effort is normal. No respiratory distress.  Skin:    General: Skin is warm and dry.     Capillary Refill: Capillary refill takes less than 2 seconds.     Findings: No rash.     Comments: 9cm laceration to the left forearm   Neurological:     General: No focal deficit present.     Mental Status: He is alert and oriented to person, place, and time.  Psychiatric:        Mood and Affect: Mood normal.        Behavior: Behavior normal.        Thought Content: Thought content normal.        Judgment: Judgment normal.     ED Results / Procedures / Treatments   Labs (all labs ordered are listed, but only abnormal results are displayed) Labs Reviewed - No data to display  EKG None  Radiology DG Forearm Left  Result Date: 10/31/2022 CLINICAL DATA:  Laceration, query foreign body. EXAM: LEFT FOREARM - 2 VIEW COMPARISON:  None Available. FINDINGS: There is no evidence of fracture or other focal bone lesions. No periostitis or erosion. Mild soft tissue edema with skin irregularity about the mid distal forearm. No  soft tissue gas or radiopaque foreign body. IMPRESSION: Soft tissue edema and skin irregularity about the mid distal forearm. No radiopaque foreign body or acute osseous abnormality. Electronically Signed   By: Keith Rake M.D.   On: 10/31/2022 15:37    Procedures .Marland KitchenLaceration Repair  Date/Time: 10/31/2022 5:11 PM  Performed by: Mickie Hillier, PA-C Authorized by: Mickie Hillier, PA-C   Consent:    Consent obtained:  Verbal   Consent given by:  Patient   Risks, benefits, and alternatives were discussed: yes     Risks discussed:  Infection, pain, retained foreign body, need for additional repair, poor wound healing, vascular damage and tendon damage   Alternatives discussed:  No treatment Universal protocol:    Procedure explained and questions answered to patient or proxy's  satisfaction: yes     Relevant documents present and verified: yes     Test results available: yes     Imaging studies available: yes     Required blood products, implants, devices, and special equipment available: yes     Site/side marked: yes     Immediately prior to procedure, a time out was called: yes     Patient identity confirmed:  Verbally with patient Anesthesia:    Anesthesia method:  Local infiltration   Local anesthetic:  Lidocaine 2% WITH epi Laceration details:    Location:  Shoulder/arm   Shoulder/arm location:  L lower arm   Length (cm):  9   Depth (mm):  9 Pre-procedure details:    Preparation:  Patient was prepped and draped in usual sterile fashion and imaging obtained to evaluate for foreign bodies Exploration:    Limited defect created (wound extended): no     Hemostasis achieved with:  Epinephrine and direct pressure   Imaging obtained: x-ray     Imaging outcome: foreign body not noted     Wound exploration: wound explored through full range of motion and entire depth of wound visualized     Wound extent: areolar tissue violated     Wound extent: no foreign body, no signs of injury, no tendon damage and no vascular damage     Contaminated: yes   Treatment:    Area cleansed with:  Povidone-iodine and saline   Amount of cleaning:  Extensive   Irrigation solution:  Sterile saline   Irrigation volume:  1000   Irrigation method:  Pressure wash   Undermining:  Minimal Skin repair:    Repair method:  Sutures   Suture size:  5-0 (13 simple interrupted @ 5-0, 2 horizontal mattress @ 3-0)   Suture material:  Prolene   Suture technique:  Horizontal mattress and simple interrupted   Number of sutures:  15 Approximation:    Approximation:  Close Repair type:    Repair type:  Complex Post-procedure details:    Dressing:  Antibiotic ointment and non-adherent dressing   Procedure completion:  Tolerated well, no immediate complications    Medications Ordered in  ED Medications  lidocaine-EPINEPHrine (XYLOCAINE W/EPI) 2 %-1:200000 (PF) injection 20 mL (20 mLs Infiltration Given by Other 10/31/22 1645)  Tdap (BOOSTRIX) injection 0.5 mL (0.5 mLs Intramuscular Given 10/31/22 1513)    ED Course/ Medical Decision Making/ A&P                             Medical Decision Making Amount and/or Complexity of Data Reviewed Radiology: ordered.  Risk Prescription drug management.  Initial Impression and Ddx  67 year old male who presents to the emergency department with laceration to left forearm.  Patient PMH that increases complexity of ED encounter:  HLD, diabetes, HTN  Interpretation of Diagnostics I independent reviewed and interpreted the labs as followed: Not indicated  - I independently visualized the following imaging with scope of interpretation limited to determining acute life threatening conditions related to emergency care: Plain film of the left forearm, which revealed no fracture or foreign body  Patient Reassessment and Ultimate Disposition/Management 67 year old male who presents to the emergency department with 9 cm laceration to the left outer forearm. Plain film does not demonstrate any underlying fracture or foreign bodies. I anesthetized the area with 2% lidocaine with epi to help obtain hemostasis.  The wound was then extensively cleaned so that I could visualize the base of the wound. He is able to move all fingers and wrist.  There does not appear to be any vascular damage.  There is no visualized tenderness injury.  It appears that the muscle belly is not involved.  Appears to be all subcutaneous involvement. He did require 2 retention sutures to hold the laceration. Tetanus was updated We reviewed laceration care instructions.  Reviewed return instructions for suture removal or returning sooner for infection, dehiscence.  He verbalized understanding.  Otherwise feel that he safe for discharge at this time.  Patient management  required discussion with the following services or consulting groups:  None  Complexity of Problems Addressed Acute complicated illness or Injury  Additional Data Reviewed and Analyzed Further history obtained from: Further history from spouse/family member and Past medical history and medications listed in the EMR  Patient Encounter Risk Assessment Minor Procedures  Final Clinical Impression(s) / ED Diagnoses Final diagnoses:  Laceration of left upper extremity, initial encounter    Rx / DC Orders ED Discharge Orders     None         Mickie Hillier, PA-C 10/31/22 1718    Hayden Rasmussen, MD 11/01/22 [phone]

## 2022-10-31 NOTE — ED Notes (Signed)
EDP at bedside suturing

## 2022-10-31 NOTE — ED Triage Notes (Signed)
Left laceration to forearm- extensive in nature. Pt tripped and fell onto sheet metal sticking up in his shop. Unsure when last tetanus was. Bleeding fairly controlled.

## 2022-10-31 NOTE — ED Notes (Signed)
Patient transported to Ultrasound 

## 2022-10-31 NOTE — ED Notes (Signed)
Wound approximated well, sutures intact, dressed and bandaged.

## 2022-10-31 NOTE — Discharge Instructions (Addendum)
You were seen in the emergency department today for a laceration.  Please return in 10 days to have your sutures removed.  Please return sooner if there is any evidence of infection or if the wound opens again.  Please use Motrin and Tylenol for pain relief.  You may also use some ice to help with swelling.

## 2022-11-01 ENCOUNTER — Encounter: Payer: Self-pay | Admitting: Radiology

## 2023-01-01 ENCOUNTER — Ambulatory Visit
Admission: RE | Admit: 2023-01-01 | Discharge: 2023-01-01 | Disposition: A | Discharge status: Home / Self Care | Payer: 59 | Source: Ambulatory Visit | Attending: Family Medicine | Admitting: Family Medicine

## 2023-01-01 ENCOUNTER — Other Ambulatory Visit: Payer: Self-pay

## 2023-01-01 VITALS — BP 153/81 | HR 96 | Temp 98.9°F | Resp 20

## 2023-01-01 DIAGNOSIS — J029 Acute pharyngitis, unspecified: Secondary | ICD-10-CM | POA: Diagnosis present

## 2023-01-01 DIAGNOSIS — I10 Essential (primary) hypertension: Secondary | ICD-10-CM | POA: Diagnosis present

## 2023-01-01 LAB — POCT RAPID STREP A (OFFICE): Rapid Strep A Screen: NEGATIVE

## 2023-01-01 MED ORDER — LIDOCAINE VISCOUS HCL 2 % MT SOLN: 5.0000 mL | Freq: Four times a day (QID) | OROMUCOSAL | 0 refills | Status: AC | PRN

## 2023-01-01 NOTE — ED Triage Notes (Signed)
Pt reports sneezing, coughing this weekend and reports sore throat,fever started this morning. Home covid test negative.

## 2023-01-01 NOTE — Discharge Instructions (Signed)
You may use over the counter ibuprofen or acetaminophen as needed.  For a sore throat, over the counter products such as [employer] Peroxyl Mouth Sore Rinse or Chloraseptic Sore Throat Spray may provide some temporary relief. Your rapid strep test was negative today. We have sent your throat swab for culture and will let you know of any positive results. 

## 2023-01-01 NOTE — ED Provider Notes (Signed)
[facility]   [id number] 01/01/23 Arrival Time: 0841  ASSESSMENT & PLAN:  1. Sore throat   2. Elevated blood pressure reading with diagnosis of hypertension    No signs of peritonsillar abscess. Discussed.  Meds ordered this encounter  Medications   magic mouthwash (lidocaine, diphenhydrAMINE, alum & mag hydroxide) suspension    Sig: Swish and spit 5 mLs 4 (four) times daily as needed for mouth pain.    Dispense:  360 mL    Refill:  0   Rapid strep negative. Culture sent. Labs Reviewed  CULTURE, GROUP A STREP [facility])  POCT RAPID STREP A (OFFICE)   OTC analgesics and throat care as needed    Discharge Instructions      You may use over the counter ibuprofen or acetaminophen as needed.  For a sore throat, over the counter products such as [employer] Peroxyl Mouth Sore Rinse or Chloraseptic Sore Throat Spray may provide some temporary relief. Your rapid strep test was negative today. We have sent your throat swab for culture and will let you know of any positive results.     Slightly elevated BP noted. Reviewed expectations re: course of current medical issues. Questions answered. Outlined signs and symptoms indicating need for more acute intervention. Patient verbalized understanding. After Visit Summary given.   SUBJECTIVE:  [name] [name] is a 67 y.o. male who reports a sore throat. Abrupt onset; noted this am; mild ST/cough yesterday. Symptoms have gradually worsened since beginning; without voice changes. No respiratory symptoms. Normal PO intake but reports discomfort with swallowing. No specific alleviating factors. Feels he had fever yest. No neck pain or swelling. No associated nausea, vomiting, or abdominal pain. Known sick contacts: none. Recent travel: none. No tx PTA.    OBJECTIVE:  Vitals:   01/01/23 0920  BP: (!) 153/81  Pulse: 96  Resp: 20  Temp: 98.9 F (37.2 C)  TempSrc: Oral  SpO2: 96%     General appearance: alert; no  distress HEENT: throat with moderate erythema and cobblestoning; no tonsils visualized; uvula is midline Neck: supple with FROM; no lymphadenopathy Lungs: speaks full sentences without difficulty; unlabored Abd: soft; non-tender Skin: reveals no rash; warm and dry Psychological: alert and cooperative; normal mood and affect  No Known Allergies  Past Medical History:  Diagnosis Date   Acid reflux disease    Arthritis    Chest pain, unspecified    Derangement of posterior horn of lateral meniscus    Derangement of posterior horn of medial meniscus    Diabetes mellitus without complication (HCC)    DJD (degenerative joint disease)    Elevated PSA    requiring several bx   GERD (gastroesophageal reflux disease)    Hyperlipidemia, mixed    Hypertension    PONV (postoperative nausea and vomiting)    Sleep apnea    Stop Bang score of 5, per pt he has been screened for sleep apnea, but they said he didn't have it   Tachycardia    Tear of anterior cruciate ligament of knee    bilateral   Social History   Socioeconomic History   Marital status: Married    Spouse name: Not on file   Number of children: 2   Years of education: Not on file   Highest education level: Not on file  Occupational History   Occupation: retired  Tobacco Use   Smoking status: Former    Packs/day: 2.00    Years: 35.00    Additional pack years:  0.00    Total pack years: 70.00    Types: Cigarettes   Smokeless tobacco: Former    Quit date: 09/04/2007  Substance and Sexual Activity   Alcohol use: Yes    Comment: very rare occasion   Drug use: No   Sexual activity: Not on file  Other Topics Concern   Not on file  Social History Narrative   Not on file   Social Determinants of Health   Financial Resource Strain: Not on file  Food Insecurity: Not on file  Transportation Needs: Not on file  Physical Activity: Not on file  Stress: Not on file  Social Connections: Not on file  Intimate Partner  Violence: Not on file   History reviewed. No pertinent family history.         Mardella Layman, MD 01/01/23 1010

## 2023-01-05 LAB — CULTURE, GROUP A STREP (THRC)

## 2023-02-28 LAB — COLOGUARD

## 2023-03-11 DIAGNOSIS — Z87891 Personal history of nicotine dependence: Secondary | ICD-10-CM | POA: Diagnosis not present

## 2023-03-11 DIAGNOSIS — E663 Overweight: Secondary | ICD-10-CM | POA: Diagnosis not present

## 2023-03-11 DIAGNOSIS — E559 Vitamin D deficiency, unspecified: Secondary | ICD-10-CM | POA: Diagnosis not present

## 2023-03-11 DIAGNOSIS — F17200 Nicotine dependence, unspecified, uncomplicated: Secondary | ICD-10-CM | POA: Diagnosis not present

## 2023-03-11 DIAGNOSIS — E785 Hyperlipidemia, unspecified: Secondary | ICD-10-CM | POA: Diagnosis not present

## 2023-03-11 DIAGNOSIS — E1142 Type 2 diabetes mellitus with diabetic polyneuropathy: Secondary | ICD-10-CM | POA: Diagnosis not present

## 2023-03-11 DIAGNOSIS — G8929 Other chronic pain: Secondary | ICD-10-CM | POA: Diagnosis not present

## 2023-03-11 DIAGNOSIS — I10 Essential (primary) hypertension: Secondary | ICD-10-CM | POA: Diagnosis not present

## 2023-03-11 DIAGNOSIS — K219 Gastro-esophageal reflux disease without esophagitis: Secondary | ICD-10-CM | POA: Diagnosis not present

## 2023-03-11 DIAGNOSIS — E1169 Type 2 diabetes mellitus with other specified complication: Secondary | ICD-10-CM | POA: Diagnosis not present

## 2023-03-11 DIAGNOSIS — Z7982 Long term (current) use of aspirin: Secondary | ICD-10-CM | POA: Diagnosis not present

## 2023-03-19 DIAGNOSIS — Z1211 Encounter for screening for malignant neoplasm of colon: Secondary | ICD-10-CM | POA: Diagnosis not present

## 2023-03-21 DIAGNOSIS — M7711 Lateral epicondylitis, right elbow: Secondary | ICD-10-CM | POA: Diagnosis not present

## 2023-03-26 LAB — COLOGUARD: COLOGUARD: NEGATIVE

## 2023-03-26 LAB — EXTERNAL GENERIC LAB PROCEDURE: COLOGUARD: NEGATIVE

## 2023-04-01 ENCOUNTER — Emergency Department (HOSPITAL_COMMUNITY)
Admission: EM | Admit: 2023-04-01 | Discharge: 2023-04-01 | Disposition: A | Discharge status: Home / Self Care | Payer: PPO | Attending: Emergency Medicine | Admitting: Emergency Medicine

## 2023-04-01 ENCOUNTER — Emergency Department (HOSPITAL_COMMUNITY): Payer: PPO

## 2023-04-01 ENCOUNTER — Encounter (HOSPITAL_COMMUNITY): Payer: Self-pay

## 2023-04-01 ENCOUNTER — Other Ambulatory Visit: Payer: Self-pay

## 2023-04-01 DIAGNOSIS — S199XXA Unspecified injury of neck, initial encounter: Secondary | ICD-10-CM | POA: Diagnosis not present

## 2023-04-01 DIAGNOSIS — R930 Abnormal findings on diagnostic imaging of skull and head, not elsewhere classified: Secondary | ICD-10-CM | POA: Diagnosis not present

## 2023-04-01 DIAGNOSIS — S06361A Traumatic hemorrhage of cerebrum, unspecified, with loss of consciousness of 30 minutes or less, initial encounter: Secondary | ICD-10-CM | POA: Insufficient documentation

## 2023-04-01 DIAGNOSIS — W01198A Fall on same level from slipping, tripping and stumbling with subsequent striking against other object, initial encounter: Secondary | ICD-10-CM | POA: Diagnosis not present

## 2023-04-01 DIAGNOSIS — S0101XA Laceration without foreign body of scalp, initial encounter: Secondary | ICD-10-CM | POA: Insufficient documentation

## 2023-04-01 DIAGNOSIS — S0990XA Unspecified injury of head, initial encounter: Secondary | ICD-10-CM

## 2023-04-01 DIAGNOSIS — I10 Essential (primary) hypertension: Secondary | ICD-10-CM | POA: Diagnosis not present

## 2023-04-01 DIAGNOSIS — Y9373 Activity, racquet and hand sports: Secondary | ICD-10-CM | POA: Insufficient documentation

## 2023-04-01 DIAGNOSIS — S06341A Traumatic hemorrhage of right cerebrum with loss of consciousness of 30 minutes or less, initial encounter: Secondary | ICD-10-CM

## 2023-04-01 MED ORDER — LIDOCAINE-EPINEPHRINE (PF) 2 %-1:200000 IJ SOLN
10.0000 mL | Freq: Once | INTRAMUSCULAR | Status: AC
  Administered 2023-04-01: 10 mL
  Filled 2023-04-01: qty 20

## 2023-04-01 NOTE — ED Triage Notes (Signed)
Pt reports he was playing pickle ball and was running backwards to get a ball and hit his head on a concrete wall.  Pt says he was told he blacked out for a few seconds and does not remember hitting the wall.  Pt is A&Ox4 and reports taking a baby aspirin daily.

## 2023-04-01 NOTE — ED Provider Notes (Signed)
[place] EMERGENCY DEPARTMENT AT [facility] Provider Note   CSN: [id number] Arrival date & time: 04/01/23  1144     History  Chief Complaint  Patient presents with   Head Laceration    [name] [name] is a 67 y.o. male.  He is here for evaluation of head laceration and head injury.  He was playing pickle ball running backwards and fell back hitting his head.  There was reported loss of consciousness for few seconds.  Complaining of a little bit of pain to his head.  No nausea or vomiting.  He is on aspirin but no other blood thinners.  No other complaints.  Last tetanus shot was 6 months ago.  The history is provided by the patient.  Head Laceration This is a new problem. The current episode started 1 to 2 hours ago. The problem occurs constantly. The problem has not changed since onset.Associated symptoms include headaches. Pertinent negatives include no chest pain, no abdominal pain and no shortness of breath. Nothing aggravates the symptoms. Nothing relieves the symptoms. He has tried nothing for the symptoms. The treatment provided mild relief.       Home Medications Prior to Admission medications   Medication Sig Start Date End Date Taking? Authorizing Provider  carvedilol (COREG) 12.5 MG tablet Take 12.5 mg by mouth 2 (two) times daily with a meal. Patient not taking: Reported on 01/01/2023    [provider]  dapagliflozin propanediol (FARXIGA) 5 MG TABS tablet Take 5 mg by mouth daily.    [provider]  Dulaglutide (TRULICITY) 3 MG/0.5ML SOPN Inject into the skin.    [provider]  esomeprazole (NEXIUM) 40 MG capsule Take 40 mg by mouth daily before breakfast.      [provider]  icosapent Ethyl (VASCEPA) 1 g capsule Take 2 g by mouth 2 (two) times daily.    [provider]  magic mouthwash (lidocaine, diphenhydrAMINE, alum & mag hydroxide) suspension Swish and spit 5 mLs 4 (four) times daily as needed for mouth  pain. 01/01/23   Mardella Layman, MD  olmesartan-hydrochlorothiazide (BENICAR HCT) 40-25 MG tablet Take 1 tablet by mouth daily. 09/03/22   [provider]  pravastatin (PRAVACHOL) 40 MG tablet Take 40 mg by mouth daily.      [provider]      Allergies    Patient has no known allergies.    Review of Systems   Review of Systems  Eyes:  Negative for visual disturbance.  Respiratory:  Negative for shortness of breath.   Cardiovascular:  Negative for chest pain.  Gastrointestinal:  Negative for abdominal pain.  Skin:  Positive for wound.  Neurological:  Positive for headaches.    Physical Exam Updated Vital Signs BP (!) 136/93 (BP Location: Right Arm)   Pulse 78   Temp 98.3 F (36.8 C) (Oral)   Resp 16   Ht 5\' 8"  (1.727 m)   Wt 88.5 kg   SpO2 97%   BMI 29.65 kg/m  Physical Exam Vitals and nursing note reviewed.  Constitutional:      General: He is not in acute distress.    Appearance: Normal appearance. He is well-developed.  HENT:     Head: Normocephalic.     Comments: 2.5 cm laceration crown of head. Eyes:     Conjunctiva/sclera: Conjunctivae normal.  Cardiovascular:     Rate and Rhythm: Normal rate and regular rhythm.     Heart sounds: No murmur heard. Pulmonary:  Effort: Pulmonary effort is normal. No respiratory distress.     Breath sounds: Normal breath sounds.  Abdominal:     Palpations: Abdomen is soft.     Tenderness: There is no abdominal tenderness.  Musculoskeletal:        General: No deformity. Normal range of motion.     Cervical back: Neck supple.  Skin:    General: Skin is warm and dry.     Capillary Refill: Capillary refill takes less than 2 seconds.  Neurological:     General: No focal deficit present.     Mental Status: He is alert and oriented to person, place, and time.     Cranial Nerves: No cranial nerve deficit.     Sensory: No sensory deficit.     Motor: No weakness.     ED Results / Procedures / Treatments    Labs (all labs ordered are listed, but only abnormal results are displayed) Labs Reviewed - No data to display  EKG EKG Interpretation Date/Time:  Monday April 01 2023 12:19:01 EDT Ventricular Rate:  77 PR Interval:  187 QRS Duration:  101 QT Interval:  383 QTC Calculation: 434 R Axis:   -7  Text Interpretation: Sinus rhythm No significant change since prior 12/14 Confirmed by Meridee Score [phone]) on 04/01/2023 12:23:31 PM  Radiology CT Head Wo Contrast  Result Date: 04/01/2023 CLINICAL DATA:  Head trauma, minor (Age >= 65y); Neck trauma (Age >= 65y) EXAM: CT HEAD WITHOUT CONTRAST CT CERVICAL SPINE WITHOUT CONTRAST TECHNIQUE: Multidetector CT imaging of the head and cervical spine was performed following the standard protocol without intravenous contrast. Multiplanar CT image reconstructions of the cervical spine were also generated. RADIATION DOSE REDUCTION: This exam was performed according to the departmental dose-optimization program which includes automated exposure control, adjustment of the mA and/or kV according to patient size and/or use of iterative reconstruction technique. COMPARISON:  None Available. FINDINGS: CT HEAD FINDINGS Brain: 3 mm hyperdense focus in the right parietal lobe (series 2, image 23) is favored to represent a small hemorrhagic contusion. No evidence of acute infarction, hydrocephalus, extra-axial collection or mass lesion/mass effect. Vascular: No hyperdense vessel or unexpected calcification. Skull: Soft tissue laceration along the right parietal scalp. No evidence of underlying calvarial fracture. Sinuses/Orbits: No middle ear or mastoid effusion. Paranasal sinuses are clear. Orbits are unremarkable. Other: None. CT CERVICAL SPINE FINDINGS Alignment: Normal. Skull base and vertebrae: No acute fracture. No primary bone lesion or focal pathologic process. Soft tissues and spinal canal: No prevertebral fluid or swelling. No visible canal hematoma. Disc levels:  No  evidence of high-grade spinal stenosis. Upper chest: Negative. Other: None IMPRESSION: 1. 3 mm hyperdense focus in the right parietal lobe is favored to represent a small hemorrhagic contusion. Recommend short term follow up CT to ensure stability. 2. Soft tissue laceration along the right parietal scalp. No evidence of underlying calvarial fracture. 3. No acute cervical spine fracture. Electronically Signed   By: Lorenza Cambridge M.D.   On: 04/01/2023 15:06   CT Cervical Spine Wo Contrast  Result Date: 04/01/2023 CLINICAL DATA:  Head trauma, minor (Age >= 65y); Neck trauma (Age >= 65y) EXAM: CT HEAD WITHOUT CONTRAST CT CERVICAL SPINE WITHOUT CONTRAST TECHNIQUE: Multidetector CT imaging of the head and cervical spine was performed following the standard protocol without intravenous contrast. Multiplanar CT image reconstructions of the cervical spine were also generated. RADIATION DOSE REDUCTION: This exam was performed according to the departmental dose-optimization program which includes automated exposure control, adjustment  of the mA and/or kV according to patient size and/or use of iterative reconstruction technique. COMPARISON:  None Available. FINDINGS: CT HEAD FINDINGS Brain: 3 mm hyperdense focus in the right parietal lobe (series 2, image 23) is favored to represent a small hemorrhagic contusion. No evidence of acute infarction, hydrocephalus, extra-axial collection or mass lesion/mass effect. Vascular: No hyperdense vessel or unexpected calcification. Skull: Soft tissue laceration along the right parietal scalp. No evidence of underlying calvarial fracture. Sinuses/Orbits: No middle ear or mastoid effusion. Paranasal sinuses are clear. Orbits are unremarkable. Other: None. CT CERVICAL SPINE FINDINGS Alignment: Normal. Skull base and vertebrae: No acute fracture. No primary bone lesion or focal pathologic process. Soft tissues and spinal canal: No prevertebral fluid or swelling. No visible canal hematoma.  Disc levels:  No evidence of high-grade spinal stenosis. Upper chest: Negative. Other: None IMPRESSION: 1. 3 mm hyperdense focus in the right parietal lobe is favored to represent a small hemorrhagic contusion. Recommend short term follow up CT to ensure stability. 2. Soft tissue laceration along the right parietal scalp. No evidence of underlying calvarial fracture. 3. No acute cervical spine fracture. Electronically Signed   By: Lorenza Cambridge M.D.   On: 04/01/2023 15:06    Procedures .Marland KitchenLaceration Repair  Date/Time: 04/01/2023 1:04 PM  Performed by: Terrilee Files, MD Authorized by: Terrilee Files, MD   Consent:    Consent obtained:  Verbal   Consent given by:  Patient   Risks, benefits, and alternatives were discussed: yes     Risks discussed:  Infection, nerve damage, poor wound healing, pain, retained foreign body, tendon damage and vascular damage   Alternatives discussed:  No treatment, delayed treatment and referral Universal protocol:    Procedure explained and questions answered to patient or proxy's satisfaction: yes     Patient identity confirmed:  Verbally with patient Anesthesia:    Anesthesia method:  Local infiltration   Local anesthetic:  Lidocaine 2% WITH epi Laceration details:    Location:  Scalp   Scalp location:  Crown   Length (cm):  2.5 Treatment:    Irrigation solution:  Sterile saline Skin repair:    Repair method:  Staples   Number of staples:  2 Approximation:    Approximation:  Close Repair type:    Repair type:  Simple Post-procedure details:    Dressing:  Open (no dressing)   Procedure completion:  Tolerated well, no immediate complications     Medications Ordered in ED Medications  lidocaine-EPINEPHrine (XYLOCAINE W/EPI) 2 %-1:200000 (PF) injection 10 mL (has no administration in time range)    ED Course/ Medical Decision Making/ A&P                             Medical Decision Making Amount and/or Complexity of Data  Reviewed Radiology: ordered.  Risk Prescription drug management.   This patient complains of head injury status post fall; this involves an extensive number of treatment Options and is a complaint that carries with it a high risk of complications and morbidity. The differential includes laceration contusion fracture intracranial bleed  I ordered imaging studies which included head and C-spine CT and I independently    visualized and interpreted imaging which showed intraparenchymal hemorrhage Additional history obtained from patient's wife Previous records obtained and reviewed in epic no recent admissions I consulted neurosurgery and discussed lab and imaging findings and discussed disposition.  Cardiac monitoring reviewed, sinus rhythm Social determinants considered,  no significant barriers Critical Interventions: None  After the interventions stated above, I reevaluated the patient and found patient to be awake alert no distress Admission and further testing considered, his care is signed out to Dr. Estell Harpin to follow-up on neurosurgery recommendations.         Final Clinical Impression(s) / ED Diagnoses Final diagnoses:  Injury of head, initial encounter  Laceration of scalp, initial encounter  Traumatic right-sided intracerebral hemorrhage with loss of consciousness of 30 minutes or less, initial encounter [facility])    Rx / DC Orders ED Discharge Orders     None         Terrilee Files, MD 04/01/23 1750

## 2023-04-01 NOTE — Discharge Instructions (Addendum)
Follow up to get staples out in one week.   Also follow up with the neurosurgeon group in 1-2 weeks

## 2023-04-08 DIAGNOSIS — Z4802 Encounter for removal of sutures: Secondary | ICD-10-CM | POA: Diagnosis not present

## 2023-04-08 DIAGNOSIS — S0990XD Unspecified injury of head, subsequent encounter: Secondary | ICD-10-CM | POA: Diagnosis not present

## 2023-04-10 ENCOUNTER — Encounter: Payer: Self-pay | Admitting: Orthopedic Surgery

## 2023-04-10 ENCOUNTER — Ambulatory Visit (INDEPENDENT_AMBULATORY_CARE_PROVIDER_SITE_OTHER): Payer: PPO | Admitting: Orthopedic Surgery

## 2023-04-10 VITALS — BP 116/75 | HR 90 | Ht 68.0 in | Wt 199.0 lb

## 2023-04-10 DIAGNOSIS — M7711 Lateral epicondylitis, right elbow: Secondary | ICD-10-CM | POA: Diagnosis not present

## 2023-04-10 DIAGNOSIS — M1712 Unilateral primary osteoarthritis, left knee: Secondary | ICD-10-CM | POA: Diagnosis not present

## 2023-04-10 DIAGNOSIS — G8929 Other chronic pain: Secondary | ICD-10-CM

## 2023-04-10 NOTE — Progress Notes (Signed)
Orthopaedic Clinic Return  Assessment: Kaegan ROURKE KASE is a 67 y.o. male with the following: Left knee pain  Plan: Mr. Totin continues to have pain, primarily in the medial aspect of the left knee.  Prior HA injections provided excellent relief of his symptoms.  And progressively returning.  He would like to proceed with repeat injections.  I think this is reasonable.  We will seek authorization, and then contact him to schedule follow-up appointments.  He is also having right sided tennis elbow.  Exercises provided.  Continue to wear the brace.  Activities as tolerated.  He can return for an injection if this continues to worsen.     Follow-up: Return for After Insurance Authorization for Injection.   Subjective:  Chief Complaint  Patient presents with   Knee Pain    Left knee pain / wants to discuss hyaluronic acid injections /declines steroid injection today     History of Present Illness: [name] [name] is a 67 y.o. male who returns to clinic for repeat evaluation of left knee pain.  He is also having right lateral elbow pain.  He remains active.  Prior HA injections were effective.  These provided symptomatic relief until just recently.  He states he can start to feel some pain, primarily in the medial aspect of the left knee.  He is interested in repeat HA injections.  He is also having pain in the lateral right elbow.  He continues to play pickle ball.  This is causing pain.  He is using a brace, which is helpful.  Review of Systems: No fevers or chills No numbness or tingling No chest pain No shortness of breath No bowel or bladder dysfunction No GI distress No headaches   Objective: BP 116/75   Pulse 90   Ht 5\' 8"  (1.727 m)   Wt 199 lb (90.3 kg)   BMI 30.26 kg/m   Physical Exam:  An oriented.  No acute distress.  Left-sided antalgic gait.  Mild varus alignment of the left knee.  No swelling.  Tenderness palpation of the medial left knee.  Well-healed anterior  based surgical incisions.  He has tenderness palpation over the lateral epicondyle of the right elbow.  No bruising in this area.   IMAGING: I personally ordered and reviewed the following images:  No new imaging obtained today.  Oliver Barre, MD 04/10/2023 4:13 PM

## 2023-04-10 NOTE — Patient Instructions (Addendum)
We will check which brand of Hyaluronic acid injections (there are several) your insurance covers. We will call you with price and schedule with you if insurance approves and you are okay with the out of pocket costs. If for any reason they will not cover or the out of pocket costs are high, we will discuss with you and let you know other options available. This process normally takes several weeks to hear back from [place] the insurance approval process takes time.      Tennis Elbow Rehab Do exercises exactly as told by your health care provider and adjust them as directed. It is normal to feel mild stretching, pulling, tightness, or discomfort as you do these exercises. Stop right away if you feel sudden pain or your pain gets worse.   Stretching and range-of-motion exercises These exercises warm up your muscles and joints and improve the movement andflexibility of your elbow. Wrist flexion, assisted  Straighten your left / right elbow in front of you with your palm facing down toward the floor. If told by your health care provider, bend your left / right elbow to a 90-degree angle (right angle) at your side instead of holding it straight. With your other hand, gently push over the back of your left / right hand so your fingers point toward the floor (flexion). Stop when you feel a gentle stretch on the back of your forearm. Hold this position for 10 seconds. Repeat 10 times. Complete this exercise 1-2 times a day. Wrist extension, assisted  Straighten your left / right elbow in front of you with your palm facing up toward the ceiling. If told by your health care provider, bend your left / right elbow to a 90-degree angle (right angle) at your side instead of holding it straight. With your other hand, gently pull your left / right hand and fingers toward the floor (extension). Stop when you feel a gentle stretch on the palm side of your forearm. Hold this position for 10 seconds. Repeat 10 times.  Complete this exercise 1-2 times a day. Assisted forearm rotation, supination Sit or stand with your elbows at your side. Bend your left / right elbow to a 90-degree angle (right angle). Using your uninjured hand, turn your left / right palm up toward the ceiling (supination) until you feel a gentle stretch along the inside of your forearm. Hold this position for 10 seconds. Repeat 10 times. Complete this exercise 1-2 times a day. Assisted forearm rotation, pronation Sit or stand with your elbows at your side. Bend your left / right elbow to a 90-degree angle (right angle). Using your uninjured hand, turn your left / right palm down toward the floor (pronation) until you feel a gentle stretch along the outside of your forearm. Hold this position for 10 seconds. Repeat 10 times. Complete this exercise 1-2 times a day. Strengthening exercises These exercises build strength and endurance in your forearm and elbow. Endurance is the ability to use your muscles for a long time, even after theyget tired. Radial deviation  Stand with a 5 lbs weight or a hammer in your left / right hand. Or, sit while holding a rubber exercise band or tubing, with your left / right forearm supported on a table or countertop. Position your forearm so that the thumb is facing the ceiling, as if you are going to clap your hands. This is the neutral position. Raise your hand upward in front of you so your thumb moves toward the ceiling (  radial deviation), or pull up on the rubber tubing. Keep your forearm and elbow still while you move your wrist only. Hold this position for 10 seconds. Slowly return to the starting position. Repeat 10 times. Complete this exercise 1-2 times a day. Wrist extension, eccentric Sit with your left / right forearm palm-down and supported on a table or other surface. Let your left / right wrist extend over the edge of the surface. Hold a 5 lbs (can of soup) weight or a piece of exercise band  or tubing in your left / right hand. If using a rubber exercise band or tubing, hold the other end of the tubing with your other hand. Use your uninjured hand to move your left / right hand up toward the ceiling. Take your uninjured hand away and slowly return to the starting position using only your left / right hand. Lowering your arm under tension is called eccentric extension. Repeat 10 times. Complete this exercise 1-2 times a day. Wrist extension  Do not do this exercise if it causes pain at the outside of your elbow. Only do this exercise once instructed by your health care provider. Sit with your left / right forearm supported on a table or other surface and your palm turned down toward the floor. Let your left / right wrist extend over the edge of the surface. Hold a 5 lbs weight or a piece of rubber exercise band or tubing. If you are using a rubber exercise band or tubing, hold the band or tubing in place with your other hand to provide resistance. Slowly bend your wrist so your hand moves up toward the ceiling (extension). Move only your wrist, keeping your forearm and elbow still. Hold this position for 10 seconds. Slowly return to the starting position. Repeat 10 times. Complete this exercise 1-2 times a day. Forearm rotation, supination To do this exercise, you will need a lightweight hammer or rubber mallet. Sit with your left / right forearm supported on a table or other surface. Bend your elbow to a 90-degree angle (right angle). Position your forearm so that your palm is facing down toward the floor, with your hand resting over the edge of the table. Hold a hammer in your left / right hand. To make this exercise easier, hold the hammer near the head of the hammer. To make this exercise harder, hold the hammer near the end of the handle. Without moving your wrist or elbow, slowly rotate your forearm so your palm faces up toward the ceiling (supination). Hold this position for  10 seconds. Slowly return to the starting position. Repeat 10 times. Complete this exercise 1-2 times a day. Shoulder blade squeeze Sit in a stable chair or stand with good posture. If you are sitting down, do not let your back touch the back of the chair. Your arms should be at your sides with your elbows bent to a 90-degree angle (right angle). Position your forearms so that your thumbs are facing the ceiling (neutral position). Without lifting your shoulders up, squeeze your shoulder blades tightly together. Hold this position for 10 seconds. Slowly release and return to the starting position. Repeat 10 times. Complete this exercise 1-2 times a day. This information is not intended to replace advice given to you by your health care provider. Make sure you discuss any questions you have with your healthcare provider. Document Revised: 11/11/2019 Document Reviewed: 11/11/2019 Elsevier Patient Education  2022 [employer].

## 2023-04-11 ENCOUNTER — Telehealth: Payer: Self-pay

## 2023-04-11 NOTE — Telephone Encounter (Signed)
VOB submitted for Orthovisc, left knee.  

## 2023-04-24 ENCOUNTER — Telehealth: Payer: Self-pay

## 2023-04-24 NOTE — Telephone Encounter (Signed)
Please schedule for 3 appointments with Dr. Dallas Schimke for gel injection. All information has been added to referral.

## 2023-05-01 ENCOUNTER — Encounter: Payer: Self-pay | Admitting: Orthopedic Surgery

## 2023-05-01 ENCOUNTER — Ambulatory Visit: Payer: PPO | Admitting: Orthopedic Surgery

## 2023-05-01 DIAGNOSIS — M1712 Unilateral primary osteoarthritis, left knee: Secondary | ICD-10-CM

## 2023-05-01 MED ORDER — HYALURONAN 30 MG/2ML IX SOSY
30.0000 mg | PREFILLED_SYRINGE | Freq: Once | INTRA_ARTICULAR | Status: AC
  Administered 2023-05-01: 30 mg via INTRA_ARTICULAR

## 2023-05-01 NOTE — Progress Notes (Addendum)
Orthopaedic Clinic Return  Assessment: [name] [name] is a 67 y.o. male with the following: Chronic left knee pain, history of left knee arthroscopy  Plan: [name] continues to have pain in his left knee.  He has previously done well with hyaluronic acid injections.  He is ready to proceed with the first injection.  I will see him in 1 week for his second in a series of 3.  This patient is diagnosed with osteoarthritis of the knee(s).    Radiographs show evidence of joint space narrowing, osteophytes, subchondral sclerosis and/or subchondral cysts.  This patient has knee pain which interferes with functional and activities of daily living.    This patient has experienced inadequate response, adverse effects and/or intolerance with conservative treatments such as acetaminophen, NSAIDS, topical creams, physical therapy or regular exercise, knee bracing and/or weight loss.   This patient has experienced inadequate response or has a contraindication to intra articular steroid injections for at least 3 months.   This patient is not scheduled to have a total knee replacement within 6 months of starting treatment with viscosupplementation.   Procedure note injection Left knee joint   Verbal consent was obtained to inject the right knee joint  Timeout was completed to confirm the site of injection.  The skin was prepped with alcohol and ethyl chloride was sprayed at the injection site.  A 21-gauge needle was used to inject Orthovisc hyaluronic acid into the right knee using an anterolateral approach.  There were no complications. A sterile bandage was applied.  Follow-up: Return in about 1 week (around 05/08/2023).   Subjective:  Chief Complaint  Patient presents with   Injections    Left knee orthovisc 1    History of Present Illness: [name] [name] is a 67 y.o. male who returns to clinic for repeat evaluation of left knee pain.  I have seen him several times for his left knee.  He  previously did very well with hyaluronic acid injections.  He remains active.  He plays pickle ball on a regular basis.  Pain is starting to return, and he would like to proceed with Viscosupplementation again.   Review of Systems: No fevers or chills No numbness or tingling No chest pain No shortness of breath No bowel or bladder dysfunction No GI distress No headaches   Objective: There were no vitals taken for this visit.  Physical Exam:  General: Alert and oriented. and No acute distress. Gait: Left sided antalgic gait.   Clinically, he has a varus alignment.  Mild tenderness palpation along medial joint line.  No increased laxity to varus or valgus stress.  Negative Lachman.  Good range of motion.  IMAGING: I personally ordered and reviewed the following images:  No new imaging obtained today  Oliver Barre, MD 05/01/2023 1:51 PM

## 2023-05-07 ENCOUNTER — Telehealth: Payer: Self-pay | Admitting: Orthopedic Surgery

## 2023-05-07 NOTE — Telephone Encounter (Signed)
DR. Dallas Schimke   Patient came in the office wanting to push his injection back and he states the doctor told him if he was doing good that we can push it back    He thinks we need to give it more time to start working.  He told him he can wait.   I advised him [place] said we can move it to next Tuesday and he said lets do that and give it some more time.    Please call him back at [phone]

## 2023-05-08 ENCOUNTER — Ambulatory Visit: Payer: PPO | Admitting: Orthopedic Surgery

## 2023-05-14 ENCOUNTER — Ambulatory Visit (INDEPENDENT_AMBULATORY_CARE_PROVIDER_SITE_OTHER): Payer: PPO | Admitting: Orthopedic Surgery

## 2023-05-14 DIAGNOSIS — M1712 Unilateral primary osteoarthritis, left knee: Secondary | ICD-10-CM

## 2023-05-14 NOTE — Progress Notes (Signed)
Orthopaedic Clinic Return  Assessment: [name] [name] is a 67 y.o. male with the following: Chronic left knee pain, history of left knee arthroscopy  Plan: [name] [name] had improvement in his symptoms after the first injection.  He remains active.  He is ready to proceed with a follow-up injection today.  This patient is diagnosed with osteoarthritis of the knee(s).    Radiographs show evidence of joint space narrowing, osteophytes, subchondral sclerosis and/or subchondral cysts.  This patient has knee pain which interferes with functional and activities of daily living.    This patient has experienced inadequate response, adverse effects and/or intolerance with conservative treatments such as acetaminophen, NSAIDS, topical creams, physical therapy or regular exercise, knee bracing and/or weight loss.   This patient has experienced inadequate response or has a contraindication to intra articular steroid injections for at least 3 months.   This patient is not scheduled to have a total knee replacement within 6 months of starting treatment with viscosupplementation.   Procedure note injection Left knee joint   Verbal consent was obtained to inject the right knee joint  Timeout was completed to confirm the site of injection.  The skin was prepped with alcohol and ethyl chloride was sprayed at the injection site.  A 21-gauge needle was used to inject Orthovisc hyaluronic acid into the right knee using an anterolateral approach.  There were no complications. A sterile bandage was applied.  Follow-up: No follow-ups on file.   Subjective:  Chief Complaint  Patient presents with   Injections    #2 Orthovisc L knee     History of Present Illness: [name] [name] is a 67 y.o. male who returns to clinic for repeat evaluation of left knee pain.  He has done well since I last saw him.  We injected his left knee, approximately 2 weeks ago.  He has done well after the first few days.  He remains  active.  He is ready proceed with the second injection  Review of Systems: No fevers or chills No numbness or tingling No chest pain No shortness of breath No bowel or bladder dysfunction No GI distress No headaches   Objective: There were no vitals taken for this visit.  Physical Exam:  General: Alert and oriented. and No acute distress. Gait: Left sided antalgic gait.   Clinically, he has a varus alignment.  Mild tenderness palpation along medial joint line.  No increased laxity to varus or valgus stress.  Negative Lachman.  Good range of motion.  IMAGING: I personally ordered and reviewed the following images:  No new imaging obtained today  Oliver Barre, MD 05/14/2023 3:08 PM

## 2023-05-15 ENCOUNTER — Ambulatory Visit: Payer: PPO | Admitting: Orthopedic Surgery

## 2023-05-22 ENCOUNTER — Ambulatory Visit (INDEPENDENT_AMBULATORY_CARE_PROVIDER_SITE_OTHER): Payer: PPO | Admitting: Orthopedic Surgery

## 2023-05-22 DIAGNOSIS — M1712 Unilateral primary osteoarthritis, left knee: Secondary | ICD-10-CM

## 2023-05-22 NOTE — Patient Instructions (Signed)
Instructions Following Joint Injections  In clinic today, you received an injection in one of your joints (sometimes more than one).  Occasionally, you can have some pain at the injection site, this is normal.  You can place ice at the injection site, or take over-the-counter medications such as Tylenol (acetaminophen) or Advil (ibuprofen).  Please follow all directions listed on the bottle.  If your joint (knee or shoulder) becomes swollen, red or very painful, please contact the clinic for additional assistance.   Injections in the same joint cannot be repeated for 3 months.  This helps to limit the risk of an infection in the joint.  If you were to develop an infection in your joint, the best treatment option would be surgery.

## 2023-05-22 NOTE — Progress Notes (Signed)
Orthopaedic Clinic Return  Assessment: Yadriel TRESON BROOKMAN is a 67 y.o. male with the following: Chronic left knee pain, history of left knee arthroscopy  Plan: Mr. Scaff continues to feel better following his injections.  He is ready to proceed with the final in the series of 3.  He will follow-up in clinic as needed. This patient is diagnosed with osteoarthritis of the knee(s).    Radiographs show evidence of joint space narrowing, osteophytes, subchondral sclerosis and/or subchondral cysts.  This patient has knee pain which interferes with functional and activities of daily living.    This patient has experienced inadequate response, adverse effects and/or intolerance with conservative treatments such as acetaminophen, NSAIDS, topical creams, physical therapy or regular exercise, knee bracing and/or weight loss.   This patient has experienced inadequate response or has a contraindication to intra articular steroid injections for at least 3 months.   This patient is not scheduled to have a total knee replacement within 6 months of starting treatment with viscosupplementation.   Procedure note injection Left knee joint   Verbal consent was obtained to inject the right knee joint  Timeout was completed to confirm the site of injection.  The skin was prepped with alcohol and ethyl chloride was sprayed at the injection site.  A 21-gauge needle was used to inject Orthovisc hyaluronic acid into the right knee using an anterolateral approach.  There were no complications. A sterile bandage was applied.  Follow-up: Return if symptoms worsen or fail to improve.   Subjective:  Chief Complaint  Patient presents with   Injections    3rd orthovisc left    History of Present Illness: Imraan EFREM RYKEN is a 67 y.o. male who returns to clinic for repeat evaluation of left knee pain.  He has done well since I last saw him.  He has now had 2 injections of hyaluronic acid in his left knee.  He feels  better.  He remains active.  No complications. Review of Systems: No fevers or chills No numbness or tingling No chest pain No shortness of breath No bowel or bladder dysfunction No GI distress No headaches   Objective: There were no vitals taken for this visit.  Physical Exam:  General: Alert and oriented. and No acute distress. Gait: Left sided antalgic gait.   Clinically, he has a varus alignment.  Mild tenderness palpation along medial joint line.  No increased laxity to varus or valgus stress.  Negative Lachman.  Good range of motion.  IMAGING: I personally ordered and reviewed the following images:  No new imaging obtained today  Oliver Barre, MD 05/22/2023 1:49 PM

## 2023-06-10 NOTE — Progress Notes (Signed)
History of Present Illness: Elevated PSA  [name] [name] has a history of elevated PSA and had 2 negative prostate biopsies by Dr. Brunilda Payor. The last was in Sept 2010. His first biopsy was benign but the last had some inflammation. He has had a PCA3 that was negative.  PSA history: 05/2009 - 14.5 08/2009 - 18.4 5.11.2011 - 8.15 4.3.2012 - 6.67 10.4.2022: PSA 4.5  10.10.2023: 4.4   BPH:   Pt underwent TURP by Dr Jerre Simon in Dec 2014. 13 gms resected--all benign.   Hematuria   8.31.2021: Pt reports experiencing gross hematuria and clots starting 2.24.2021. Pt notes that blood is present throughout is urine and clots pass approximately half-way through his stream. Pt reports no new aches or pains in his bones and torso. Pt reports that TURP was successful in alleviating his BPH symptoms. He does have a history of smoking but quit about 12 years ago. He also has a history of recurrent urolithiasis.   9.25.2021: CT A/P hematuria : IMPRESSION: 1. No nephroureterolithiasis. No hydronephrosis. No suspicious enhancing renal masses. No filling defects identified within the opacified renal collecting systems and ureters. 2. Enlarged heterogeneous prostate. 10.5.2021:   Cystoscopy was negative.  10.8.2024 follow-up he is here today for routine check.  He has been quite active with pickleball and is gardening.  He has not had any worrisome issues since his visit here last year.  He did have initial hematuria in March on 1 episode.  PSA last year was 4.4.    Past Medical History:  Diagnosis Date   Acid reflux disease    Arthritis    Chest pain, unspecified    Derangement of posterior horn of lateral meniscus    Derangement of posterior horn of medial meniscus    Diabetes mellitus without complication (HCC)    DJD (degenerative joint disease)    Elevated PSA    requiring several bx   GERD (gastroesophageal reflux disease)    Hyperlipidemia, mixed    Hypertension    PONV (postoperative nausea and  vomiting)    Sleep apnea    Stop Bang score of 5, per pt he has been screened for sleep apnea, but they said he didn't have it   Tachycardia    Tear of anterior cruciate ligament of knee    bilateral    Past Surgical History:  Procedure Laterality Date   KNEE ARTHROSCOPY     Bilateral   NASAL SEPTUM SURGERY     PROSTATE BIOPSY  09/22/08    x 3   TONSILLECTOMY AND ADENOIDECTOMY     TRANSURETHRAL RESECTION OF PROSTATE N/A 08/11/2013   Procedure: TRANSURETHRAL RESECTION OF THE PROSTATE (TURP);  Surgeon: Ky Barban, MD;  Location: [facility];  Service: Urology;  Laterality: N/A;   VASECTOMY      Home Medications:  Allergies as of 06/11/2023   No Known Allergies      Medication List        Accurate as of June 10, 2023  9:24 AM. If you have any questions, ask your nurse or doctor.          carvedilol 12.5 MG tablet Commonly known as: COREG Take 12.5 mg by mouth 2 (two) times daily with a meal.   dapagliflozin propanediol 5 MG Tabs tablet Commonly known as: FARXIGA Take 5 mg by mouth daily.   esomeprazole 40 MG capsule Commonly known as: NEXIUM Take 40 mg by mouth daily before breakfast.   icosapent Ethyl 1 g capsule Commonly known  as: VASCEPA Take 2 g by mouth 2 (two) times daily.   magic mouthwash (lidocaine, diphenhydrAMINE, alum & mag hydroxide) suspension Swish and spit 5 mLs 4 (four) times daily as needed for mouth pain.   olmesartan-hydrochlorothiazide 40-25 MG tablet Commonly known as: BENICAR HCT Take 1 tablet by mouth daily.   pravastatin 40 MG tablet Commonly known as: PRAVACHOL Take 40 mg by mouth daily.   Trulicity 3 MG/0.5ML Sopn Generic drug: Dulaglutide Inject into the skin.        Allergies: No Known Allergies  No family history on file.  Social History:  reports that he has quit smoking. His smoking use included cigarettes. He has a 70 pack-year smoking history. His smokeless tobacco use includes chew. He reports that he does  not currently use alcohol. He reports that he does not use drugs.  ROS: A complete review of systems was performed.  All systems are negative except for pertinent findings as noted.  Physical Exam:  Vital signs in last 24 hours: There were no vitals taken for this visit. Constitutional:  Alert and oriented, No acute distress Cardiovascular: Regular rate  Respiratory: Normal respiratory effort Lymphatic: No lymphadenopathy Neurologic: Grossly intact, no focal deficits Psychiatric: Normal mood and affect  I have reviewed prior pt notes  I have reviewed urinalysis results  I have reviewed prior PSA and pathology results  Prior CT results reviewed     Impression/Assessment:  1.  History of elevated PSA with negative pathology and TURP, negative biopsies, stable PSA trend over last few years  2.  BPH, status post TURP 10 years ago with persistent excellent symptomatic response  Plan:  1.  His PSA is checked today  2.  I will have him come back in 1 year for routine check

## 2023-06-11 ENCOUNTER — Ambulatory Visit: Payer: 59 | Admitting: Urology

## 2023-06-11 ENCOUNTER — Encounter: Payer: Self-pay | Admitting: Urology

## 2023-06-11 VITALS — BP 146/86 | HR 76

## 2023-06-11 DIAGNOSIS — N138 Other obstructive and reflux uropathy: Secondary | ICD-10-CM

## 2023-06-11 DIAGNOSIS — N401 Enlarged prostate with lower urinary tract symptoms: Secondary | ICD-10-CM

## 2023-06-11 DIAGNOSIS — R310 Gross hematuria: Secondary | ICD-10-CM

## 2023-06-11 DIAGNOSIS — R972 Elevated prostate specific antigen [PSA]: Secondary | ICD-10-CM

## 2023-06-11 DIAGNOSIS — Z87898 Personal history of other specified conditions: Secondary | ICD-10-CM

## 2023-06-11 LAB — URINALYSIS, ROUTINE W REFLEX MICROSCOPIC
Bilirubin, UA: NEGATIVE
Leukocytes,UA: NEGATIVE
Nitrite, UA: NEGATIVE
Protein,UA: NEGATIVE
Specific Gravity, UA: 1.01 (ref 1.005–1.030)
Urobilinogen, Ur: 0.2 mg/dL (ref 0.2–1.0)
pH, UA: 6 (ref 5.0–7.5)

## 2023-06-11 LAB — MICROSCOPIC EXAMINATION
Bacteria, UA: NONE SEEN
WBC, UA: NONE SEEN /[HPF] (ref 0–5)

## 2023-06-12 LAB — PSA: Prostate Specific Ag, Serum: 4.9 ng/mL — ABNORMAL HIGH (ref 0.0–4.0)

## 2023-06-13 ENCOUNTER — Telehealth: Payer: Self-pay

## 2023-06-13 NOTE — Telephone Encounter (Signed)
Patient is made aware and voiced understanding. 

## 2023-06-13 NOTE — Telephone Encounter (Signed)
-----   Message from Bertram Millard Dahlstedt sent at 06/13/2023 12:33 PM EDT ----- Notify patient that PSA is stable at 4.9 ----- Message ----- From: Interface, [facility] Lab Results In Sent: 06/11/2023   3:36 PM EDT To: Marcine Matar, MD

## 2023-07-16 DIAGNOSIS — E559 Vitamin D deficiency, unspecified: Secondary | ICD-10-CM | POA: Diagnosis not present

## 2023-07-16 DIAGNOSIS — I10 Essential (primary) hypertension: Secondary | ICD-10-CM | POA: Diagnosis not present

## 2023-07-23 DIAGNOSIS — I10 Essential (primary) hypertension: Secondary | ICD-10-CM | POA: Diagnosis not present

## 2023-07-23 DIAGNOSIS — L309 Dermatitis, unspecified: Secondary | ICD-10-CM | POA: Diagnosis not present

## 2023-07-23 DIAGNOSIS — E669 Obesity, unspecified: Secondary | ICD-10-CM | POA: Diagnosis not present

## 2023-07-23 DIAGNOSIS — E559 Vitamin D deficiency, unspecified: Secondary | ICD-10-CM | POA: Diagnosis not present

## 2023-07-23 DIAGNOSIS — K219 Gastro-esophageal reflux disease without esophagitis: Secondary | ICD-10-CM | POA: Diagnosis not present

## 2023-07-23 DIAGNOSIS — E782 Mixed hyperlipidemia: Secondary | ICD-10-CM | POA: Diagnosis not present

## 2023-07-23 DIAGNOSIS — E1165 Type 2 diabetes mellitus with hyperglycemia: Secondary | ICD-10-CM | POA: Diagnosis not present

## 2023-11-04 ENCOUNTER — Telehealth: Payer: Self-pay | Admitting: Orthopedic Surgery

## 2023-11-04 NOTE — Telephone Encounter (Signed)
 Dr. Dallas Schimke pt - pt lvm requesting gel injections, if ok, will you place the order and once April gets approval I will schedule the pt.

## 2023-11-05 NOTE — Telephone Encounter (Signed)
 Message sent for HA injection approval.

## 2023-11-05 NOTE — Telephone Encounter (Signed)
 Called and left VM, need verification on what knee before order is submitted.

## 2023-11-05 NOTE — Telephone Encounter (Signed)
 Dr. Dallas Schimke pt - pt lvm stating it was gel injections for the left knee.  [phone]

## 2023-11-20 ENCOUNTER — Telehealth: Payer: Self-pay

## 2023-11-20 NOTE — Telephone Encounter (Signed)
 VOB submitted for Orthovisc, left knee

## 2023-11-20 NOTE — Telephone Encounter (Signed)
-----   Message from Nurse Alvera Novel D sent at 11/05/2023  2:42 PM EST ----- Regarding: HA Injections Hello,    Please assist with getting pt approved for L knee repeat HA injections. Thank you.

## 2023-11-21 ENCOUNTER — Encounter: Payer: Self-pay | Admitting: Radiology

## 2023-12-03 ENCOUNTER — Telehealth: Payer: Self-pay

## 2023-12-03 NOTE — Progress Notes (Signed)
   12/03/2023  Patient ID: [name] [name], male   DOB: [birth year], 68 y.o.   MRN: [id number]   HTA CSNP 90DS Conversion  Already got 90DS fill on Farxiga on 10/02/23, appears to now have 84DS on Trulicity as well. No fills yet this year for olmesartan-hydrochlorothiazide, carvedilol, or pravastatin. Called to confirm he had adequate supply of these. Patient did not answer but I left a message to return our call if he needed refills on these. Will monitor adherence on MAD meds going forwards.   Fayette Pho, PharmD

## 2023-12-04 ENCOUNTER — Telehealth: Payer: Self-pay

## 2023-12-04 NOTE — Progress Notes (Signed)
   12/04/2023  Patient ID: [name] [name], male   DOB: December 05, 1955, 68 y.o.   MRN: [id number]   HTA CSNP 90DS Conversion  [name] [name] returned my call, he wasn't home but remembered he was running low on carvedilol and esopmeprazole. He was unsure about olmesartan-hctz and pravastatin but assumed he was likely low on those as well. Will coordinate with PCP to send in refills for all 4 meds.   Fayette Pho, PharmD

## 2023-12-16 ENCOUNTER — Telehealth: Payer: Self-pay

## 2023-12-16 DIAGNOSIS — M1712 Unilateral primary osteoarthritis, left knee: Secondary | ICD-10-CM

## 2023-12-16 NOTE — Telephone Encounter (Signed)
 Please schedule patient for 3 appts.with Dr. Ernesta Heading for gel injection.  All gel information has been submitted under the referrals tab.

## 2023-12-31 ENCOUNTER — Encounter: Payer: Self-pay | Admitting: Orthopedic Surgery

## 2023-12-31 ENCOUNTER — Ambulatory Visit: Admitting: Orthopedic Surgery

## 2023-12-31 DIAGNOSIS — J069 Acute upper respiratory infection, unspecified: Secondary | ICD-10-CM | POA: Diagnosis not present

## 2023-12-31 DIAGNOSIS — M1712 Unilateral primary osteoarthritis, left knee: Secondary | ICD-10-CM

## 2023-12-31 DIAGNOSIS — W57XXXA Bitten or stung by nonvenomous insect and other nonvenomous arthropods, initial encounter: Secondary | ICD-10-CM | POA: Diagnosis not present

## 2023-12-31 MED ORDER — HYALURONAN 30 MG/2ML IX SOSY
30.0000 mg | PREFILLED_SYRINGE | Freq: Once | INTRA_ARTICULAR | Status: AC
  Administered 2023-12-31: 30 mg via INTRA_ARTICULAR

## 2023-12-31 NOTE — Progress Notes (Signed)
 Orthopaedic Clinic Return  Assessment: [name] [name] is a 68 y.o. male with the following: Chronic left knee pain, history of left knee arthroscopy  Plan: [name] [name] has progressively worsening pain in the left knee.  He has previously had HA injections.  These have been successful.  He would like to continue with these injections.  First injection was completed today.  I will see him in 1 week.    This patient is diagnosed with osteoarthritis of the knee(s).    Radiographs show evidence of joint space narrowing, osteophytes, subchondral sclerosis and/or subchondral cysts.  This patient has knee pain which interferes with functional and activities of daily living.    This patient has experienced inadequate response, adverse effects and/or intolerance with conservative treatments such as acetaminophen , NSAIDS, topical creams, physical therapy or regular exercise, knee bracing and/or weight loss.   This patient has experienced inadequate response or has a contraindication to intra articular steroid injections for at least 3 months.   This patient is not scheduled to have a total knee replacement within 6 months of starting treatment with viscosupplementation.   Procedure note injection Left knee joint   Verbal consent was obtained to inject the right knee joint  Timeout was completed to confirm the site of injection.  The skin was prepped with alcohol and ethyl chloride was sprayed at the injection site.  A 21-gauge needle was used to inject Orthovisc hyaluronic acid into the right knee using an anterolateral approach.  There were no complications. A sterile bandage was applied.  Follow-up: Return in about 1 week (around 01/07/2024).   Subjective:  Chief Complaint  Patient presents with   Injections    Orthovisc 1 left knee     History of Present Illness: [name] [name] is a 68 y.o. male who returns to clinic for repeat evaluation of left knee pain.  I have seen him several times  for pain in the left knee.  He has previously had HA injections, with excellent response.  He is now interested in repeating these injections today.  Review of Systems: No fevers or chills No numbness or tingling No chest pain No shortness of breath No bowel or bladder dysfunction No GI distress No headaches   Objective: There were no vitals taken for this visit.  Physical Exam:  General: Alert and oriented. and No acute distress. Gait: Left sided antalgic gait.   Clinically, he has a varus alignment.  Mild tenderness palpation along the medial joint line.  No increased laxity to varus or valgus stress.  Negative Lachman.  Good range of motion.  IMAGING: I personally ordered and reviewed the following images:  No new imaging obtained today  Tonita Frater, MD 12/31/2023 1:51 PM

## 2023-12-31 NOTE — Patient Instructions (Signed)
Instructions Following Joint Injections  In clinic today, you received an injection in one of your joints (sometimes more than one).  Occasionally, you can have some pain at the injection site, this is normal.  You can place ice at the injection site, or take over-the-counter medications such as Tylenol (acetaminophen) or Advil (ibuprofen).  Please follow all directions listed on the bottle.  If your joint (knee or shoulder) becomes swollen, red or very painful, please contact the clinic for additional assistance.   Injections in the same joint cannot be repeated for 3 months.  This helps to limit the risk of an infection in the joint.  If you were to develop an infection in your joint, the best treatment option would be surgery.

## 2024-01-07 ENCOUNTER — Ambulatory Visit: Admitting: Orthopedic Surgery

## 2024-01-07 DIAGNOSIS — M1712 Unilateral primary osteoarthritis, left knee: Secondary | ICD-10-CM | POA: Diagnosis not present

## 2024-01-07 NOTE — Patient Instructions (Signed)
Instructions Following Joint Injections  In clinic today, you received an injection in one of your joints (sometimes more than one).  Occasionally, you can have some pain at the injection site, this is normal.  You can place ice at the injection site, or take over-the-counter medications such as Tylenol (acetaminophen) or Advil (ibuprofen).  Please follow all directions listed on the bottle.  If your joint (knee or shoulder) becomes swollen, red or very painful, please contact the clinic for additional assistance.   Injections in the same joint cannot be repeated for 3 months.  This helps to limit the risk of an infection in the joint.  If you were to develop an infection in your joint, the best treatment option would be surgery.

## 2024-01-07 NOTE — Progress Notes (Signed)
 Orthopaedic Clinic Return  Assessment: [name] [name] is a 68 y.o. male with the following: Chronic left knee pain, history of left knee arthroscopy  Plan: [name] [name] continues to have improvement in his left knee symptoms with hyaluronic acid injections.  First injection was last week.  Second injection was completed in clinic today.  He will complete the series next week.    This patient is diagnosed with osteoarthritis of the knee(s).    Radiographs show evidence of joint space narrowing, osteophytes, subchondral sclerosis and/or subchondral cysts.  This patient has knee pain which interferes with functional and activities of daily living.    This patient has experienced inadequate response, adverse effects and/or intolerance with conservative treatments such as acetaminophen , NSAIDS, topical creams, physical therapy or regular exercise, knee bracing and/or weight loss.   This patient has experienced inadequate response or has a contraindication to intra articular steroid injections for at least 3 months.   This patient is not scheduled to have a total knee replacement within 6 months of starting treatment with viscosupplementation.   Procedure note injection Left knee joint   Verbal consent was obtained to inject the left knee joint  Timeout was completed to confirm the site of injection.  The skin was prepped with alcohol and ethyl chloride was sprayed at the injection site.  A 21-gauge needle was used to inject Orthovisc hyaluronic acid into the right knee using an anterolateral approach.  There were no complications. A sterile bandage was applied.  Follow-up: Return in about 1 week (around 01/14/2024).   Subjective:  Chief Complaint  Patient presents with   Left knee pain    Hyaluronic acid injection    History of Present Illness: [name] [name] is a 68 y.o. male who returns to clinic for repeat evaluation of left knee pain.  He remains active.  He has had progressively  worsening pain in the left knee.  First hyaluronic acid injection was completed last week.  No issues.  He is here today for his second injection.   Review of Systems: No fevers or chills No numbness or tingling No chest pain No shortness of breath No bowel or bladder dysfunction No GI distress No headaches   Objective: There were no vitals taken for this visit.  Physical Exam:  General: Alert and oriented. and No acute distress. Gait: Left sided antalgic gait.   Clinically, he has a varus alignment.  Mild tenderness palpation along the medial joint line.  No increased laxity to varus or valgus stress.  Negative Lachman.  Good range of motion.  IMAGING: I personally ordered and reviewed the following images:  No new imaging obtained today  Tonita Frater, MD 01/07/2024 4:50 PM

## 2024-01-14 ENCOUNTER — Ambulatory Visit: Admitting: Orthopedic Surgery

## 2024-01-15 ENCOUNTER — Ambulatory Visit (INDEPENDENT_AMBULATORY_CARE_PROVIDER_SITE_OTHER): Admitting: Orthopedic Surgery

## 2024-01-15 DIAGNOSIS — M1712 Unilateral primary osteoarthritis, left knee: Secondary | ICD-10-CM | POA: Diagnosis not present

## 2024-01-15 NOTE — Patient Instructions (Signed)
Instructions Following Joint Injections  In clinic today, you received an injection in one of your joints (sometimes more than one).  Occasionally, you can have some pain at the injection site, this is normal.  You can place ice at the injection site, or take over-the-counter medications such as Tylenol (acetaminophen) or Advil (ibuprofen).  Please follow all directions listed on the bottle.  If your joint (knee or shoulder) becomes swollen, red or very painful, please contact the clinic for additional assistance.   Injections in the same joint cannot be repeated for 3 months.  This helps to limit the risk of an infection in the joint.  If you were to develop an infection in your joint, the best treatment option would be surgery.

## 2024-01-15 NOTE — Progress Notes (Signed)
 Orthopaedic Clinic Return  Assessment: [name] [name] is a 68 y.o. male with the following: Chronic left knee pain, history of left knee arthroscopy  Plan: [name] [name] notes improvements following his previous HA injections, although the improvements have not been as pronounced as they were previously.  No issues since his last injection.  He is ready to proceed with a spinal injection today.  He will follow-up as needed.   This patient is diagnosed with osteoarthritis of the knee(s).    Radiographs show evidence of joint space narrowing, osteophytes, subchondral sclerosis and/or subchondral cysts.  This patient has knee pain which interferes with functional and activities of daily living.    This patient has experienced inadequate response, adverse effects and/or intolerance with conservative treatments such as acetaminophen , NSAIDS, topical creams, physical therapy or regular exercise, knee bracing and/or weight loss.   This patient has experienced inadequate response or has a contraindication to intra articular steroid injections for at least 3 months.   This patient is not scheduled to have a total knee replacement within 6 months of starting treatment with viscosupplementation.   Procedure note injection Left knee joint   Verbal consent was obtained to inject the left knee joint  Timeout was completed to confirm the site of injection.  The skin was prepped with alcohol and ethyl chloride was sprayed at the injection site.  A 21-gauge needle was used to inject Orthovisc hyaluronic acid into the right knee using an anterolateral approach.  There were no complications. A sterile bandage was applied.  Follow-up: Return if symptoms worsen or fail to improve.   Subjective:  No chief complaint on file.   History of Present Illness: [name] [name] is a 68 y.o. male who returns to clinic for repeat evaluation of left knee pain.  He has some improvement following the first 2 injections of  HA.  Thus far, he has not noticed the same level of improvement as prior rounds of injections.  No issues since his most recent injection.  He is ready to proceed today.  Review of Systems: No fevers or chills No numbness or tingling No chest pain No shortness of breath No bowel or bladder dysfunction No GI distress No headaches   Objective: There were no vitals taken for this visit.  Physical Exam:  General: Alert and oriented. and No acute distress. Gait: Left sided antalgic gait.   Clinically, he has a varus alignment.  Mild tenderness palpation along the medial joint line.  No increased laxity to varus or valgus stress.  Negative Lachman.  Good range of motion.  IMAGING: I personally ordered and reviewed the following images:  No new imaging obtained today  [name] Frater, MD 01/15/2024 9:17 AM

## 2024-01-17 DIAGNOSIS — I10 Essential (primary) hypertension: Secondary | ICD-10-CM | POA: Diagnosis not present

## 2024-01-17 DIAGNOSIS — E559 Vitamin D deficiency, unspecified: Secondary | ICD-10-CM | POA: Diagnosis not present

## 2024-01-23 DIAGNOSIS — Z79899 Other long term (current) drug therapy: Secondary | ICD-10-CM | POA: Diagnosis not present

## 2024-01-23 DIAGNOSIS — Z713 Dietary counseling and surveillance: Secondary | ICD-10-CM | POA: Diagnosis not present

## 2024-01-23 DIAGNOSIS — Z7182 Exercise counseling: Secondary | ICD-10-CM | POA: Diagnosis not present

## 2024-01-23 DIAGNOSIS — E669 Obesity, unspecified: Secondary | ICD-10-CM | POA: Diagnosis not present

## 2024-01-23 DIAGNOSIS — E1165 Type 2 diabetes mellitus with hyperglycemia: Secondary | ICD-10-CM | POA: Diagnosis not present

## 2024-01-23 DIAGNOSIS — Z6830 Body mass index (BMI) 30.0-30.9, adult: Secondary | ICD-10-CM | POA: Diagnosis not present

## 2024-01-23 DIAGNOSIS — E559 Vitamin D deficiency, unspecified: Secondary | ICD-10-CM | POA: Diagnosis not present

## 2024-01-23 DIAGNOSIS — E782 Mixed hyperlipidemia: Secondary | ICD-10-CM | POA: Diagnosis not present

## 2024-01-23 DIAGNOSIS — K219 Gastro-esophageal reflux disease without esophagitis: Secondary | ICD-10-CM | POA: Diagnosis not present

## 2024-01-23 DIAGNOSIS — I10 Essential (primary) hypertension: Secondary | ICD-10-CM | POA: Diagnosis not present

## 2024-01-23 DIAGNOSIS — L309 Dermatitis, unspecified: Secondary | ICD-10-CM | POA: Diagnosis not present

## 2024-06-16 ENCOUNTER — Ambulatory Visit: Payer: PPO | Admitting: Urology

## 2024-07-06 ENCOUNTER — Encounter: Payer: Self-pay | Admitting: Radiology

## 2024-08-05 ENCOUNTER — Telehealth: Payer: Self-pay | Admitting: Orthopedic Surgery

## 2024-08-05 DIAGNOSIS — G8929 Other chronic pain: Secondary | ICD-10-CM

## 2024-08-05 DIAGNOSIS — M1712 Unilateral primary osteoarthritis, left knee: Secondary | ICD-10-CM

## 2024-08-05 NOTE — Telephone Encounter (Signed)
 Dr. Onesimo pt - pt lvm stating that he would like to get gel injections again.  [phone]

## 2024-08-05 NOTE — Telephone Encounter (Signed)
 Called pt to verify he would like repeat injections in L knee, pt confirmed. Order placed and sent for authorization.

## 2024-08-18 ENCOUNTER — Ambulatory Visit: Admitting: Orthopedic Surgery

## 2024-08-18 ENCOUNTER — Other Ambulatory Visit: Payer: Self-pay | Admitting: Orthopedic Surgery

## 2024-08-18 ENCOUNTER — Encounter: Payer: Self-pay | Admitting: Orthopedic Surgery

## 2024-08-18 DIAGNOSIS — G8929 Other chronic pain: Secondary | ICD-10-CM

## 2024-08-18 DIAGNOSIS — M1712 Unilateral primary osteoarthritis, left knee: Secondary | ICD-10-CM

## 2024-08-18 MED ORDER — TRAMADOL HCL 50 MG PO TABS: 50.0000 mg | ORAL_TABLET | Freq: Four times a day (QID) | ORAL | 0 refills | Status: DC | PRN

## 2024-08-18 MED ORDER — TRAMADOL HCL 50 MG PO TABS: 50.0000 mg | ORAL_TABLET | Freq: Four times a day (QID) | ORAL | 0 refills | Status: AC | PRN

## 2024-08-18 NOTE — Telephone Encounter (Signed)
 Dr. Onesimo pt - pt lvm stating he was seen today and Dr. JAYSON sent in a script for him, but to the wrong pharmacy.  He needs this sent to [facility] in [place].

## 2024-08-18 NOTE — Progress Notes (Signed)
 Orthopaedic Clinic Return  Assessment: [name] [name] is a 68 y.o. male with the following: Chronic left knee pain, history of left knee arthroscopy  Plan: Mr. Camplin has progressively worsening pain in the left knee.  He has done well with HA injections in the past.  He is interested in hyaluronic acid injections once again.  The first injection was completed today.  I will see him in 1 week for the next injection.  In addition, he has requested something for pain.  I will send in some tramadol .    This patient is diagnosed with osteoarthritis of the knee(s).    Radiographs show evidence of joint space narrowing, osteophytes, subchondral sclerosis and/or subchondral cysts.  This patient has knee pain which interferes with functional and activities of daily living.    This patient has experienced inadequate response, adverse effects and/or intolerance with conservative treatments such as acetaminophen , NSAIDS, topical creams, physical therapy or regular exercise, knee bracing and/or weight loss.   This patient has experienced inadequate response or has a contraindication to intra articular steroid injections for at least 3 months.   This patient is not scheduled to have a total knee replacement within 6 months of starting treatment with viscosupplementation.   Procedure note injection Left knee joint   Verbal consent was obtained to inject the left knee joint  Timeout was completed to confirm the site of injection.  The skin was prepped with alcohol and ethyl chloride was sprayed at the injection site.  A 21-gauge needle was used to inject Orthovisc hyaluronic acid into the right knee using an anterolateral approach.  There were no complications. A sterile bandage was applied.  Follow-up: Return in about 1 week (around 08/25/2024).   Subjective:  Chief Complaint  Patient presents with   Injections    Ortho Visc #1 L Knee  WMR:[id number] Onu:[id number] Exp: 05/23/25     History of Present Illness: [name] [name] is a 68 y.o. male who returns to clinic for repeat evaluation of left knee pain.  I have seen him several times for pain in the left knee.  He states his left knee has started to bother him again.  He is taking a lot of ibuprofen, and is concerned that this has not been as effective.  He also does not want to continue to take the medicine and hurt his kidneys.  Pain is primarily medial aspect of the left knee.  He has not been as active.    Of note, his wife was recently diagnosed with lung cancer.  She is on undergone treatment including radiation and chemotherapy.  Review of Systems: No fevers or chills No numbness or tingling No chest pain No shortness of breath No bowel or bladder dysfunction No GI distress No headaches   Objective: There were no vitals taken for this visit.  Physical Exam:  General: Alert and oriented. and No acute distress. Gait: Left sided antalgic gait.   Clinically, he has a varus alignment.  Mild tenderness palpation along the medial joint line.  No increased laxity to varus or valgus stress.  Negative Lachman.  Good range of motion.  IMAGING: I personally ordered and reviewed the following images:  No new imaging obtained today  [name], MD 08/18/2024 1:46 PM

## 2024-08-19 ENCOUNTER — Telehealth: Payer: Self-pay

## 2024-08-20 NOTE — Telephone Encounter (Signed)
 ERROR

## 2024-08-25 ENCOUNTER — Ambulatory Visit: Admitting: Orthopedic Surgery

## 2024-08-25 DIAGNOSIS — M1712 Unilateral primary osteoarthritis, left knee: Secondary | ICD-10-CM | POA: Diagnosis not present

## 2024-08-25 NOTE — Progress Notes (Signed)
 Orthopaedic Clinic Return  Assessment: [name] [name] is a 68 y.o. male with the following: Chronic left knee pain, history of left knee arthroscopy  Plan: [name] [name] has progressively worsening pain in the left knee.  He has arthritis.  He does well with hyaluronic acid injections.  First injection was last week.  He is here for second injection.  No issues.  He will follow-up in 1 week for the final in the series of 3.  This patient is diagnosed with osteoarthritis of the knee(s).    Radiographs show evidence of joint space narrowing, osteophytes, subchondral sclerosis and/or subchondral cysts.  This patient has knee pain which interferes with functional and activities of daily living.    This patient has experienced inadequate response, adverse effects and/or intolerance with conservative treatments such as acetaminophen , NSAIDS, topical creams, physical therapy or regular exercise, knee bracing and/or weight loss.   This patient has experienced inadequate response or has a contraindication to intra articular steroid injections for at least 3 months.   This patient is not scheduled to have a total knee replacement within 6 months of starting treatment with viscosupplementation.   Procedure note injection Left knee joint   Verbal consent was obtained to inject the left knee joint  Timeout was completed to confirm the site of injection.  The skin was prepped with alcohol and ethyl chloride was sprayed at the injection site.  A 21-gauge needle was used to inject Orthovisc hyaluronic acid into the right knee using an anterolateral approach.  There were no complications. A sterile bandage was applied.  Follow-up: Return in about 1 week (around 09/01/2024).   Subjective:  Chief Complaint  Patient presents with   Injections    Ortho Visc #2 L knee    History of Present Illness: [name] [name] is a 68 y.o. male who returns to clinic for repeat evaluation of left knee pain.  He  continues to have pain in the left knee.  HA injection was completed last week.  No issues.  He notes improved range of motion, since his last injection.  Tramadol  has been effective.   Review of Systems: No fevers or chills No numbness or tingling No chest pain No shortness of breath No bowel or bladder dysfunction No GI distress No headaches   Objective: There were no vitals taken for this visit.  Physical Exam:  General: Alert and oriented. and No acute distress. Gait: Left sided antalgic gait.   Clinically, he has a varus alignment.  Mild tenderness palpation along the medial joint line.  No increased laxity to varus or valgus stress.  Negative Lachman.  Good range of motion.  IMAGING: I personally ordered and reviewed the following images:  No new imaging obtained today  [name] DELENA Horde, MD 08/25/2024 1:50 PM

## 2024-09-01 ENCOUNTER — Ambulatory Visit: Admitting: Orthopedic Surgery

## 2024-09-01 DIAGNOSIS — M1712 Unilateral primary osteoarthritis, left knee: Secondary | ICD-10-CM

## 2024-09-01 MED ORDER — HYALURONAN 30 MG/2ML IX SOSY
30.0000 mg | PREFILLED_SYRINGE | Freq: Once | INTRA_ARTICULAR | Status: AC
  Administered 2024-09-01: 30 mg via INTRA_ARTICULAR

## 2024-09-01 NOTE — Progress Notes (Signed)
 Orthopaedic Clinic Return  Assessment: [name] [name] is a 68 y.o. male with the following: Chronic left knee pain, history of left knee arthroscopy  Plan: [name] [name] has progressively worsening pain in the left knee.  He has arthritis.  He does well with hyaluronic acid injections.  He has completed 2 injections.  No issues.  He would like to proceed with third and final injection today.  He will follow-up as needed.  This patient is diagnosed with osteoarthritis of the knee(s).    Radiographs show evidence of joint space narrowing, osteophytes, subchondral sclerosis and/or subchondral cysts.  This patient has knee pain which interferes with functional and activities of daily living.    This patient has experienced inadequate response, adverse effects and/or intolerance with conservative treatments such as acetaminophen , NSAIDS, topical creams, physical therapy or regular exercise, knee bracing and/or weight loss.   This patient has experienced inadequate response or has a contraindication to intra articular steroid injections for at least 3 months.   This patient is not scheduled to have a total knee replacement within 6 months of starting treatment with viscosupplementation.   Procedure note injection Left knee joint   Verbal consent was obtained to inject the left knee joint  Timeout was completed to confirm the site of injection.  The skin was prepped with alcohol and ethyl chloride was sprayed at the injection site.  A 21-gauge needle was used to inject Orthovisc hyaluronic acid into the right knee using an anterolateral approach.  There were no complications. A sterile bandage was applied.  Follow-up: No follow-ups on file.   Subjective:  Chief Complaint  Patient presents with   Injections    Left knee Orthovisc #3     History of Present Illness: [name] [name] is a 68 y.o. male who returns to clinic for repeat evaluation of left knee pain.  He has had 2 HA injections.  No  issues.  He states his knee is feeling a little bit more painful, after vigorous activity this morning.    Review of Systems: No fevers or chills No numbness or tingling No chest pain No shortness of breath No bowel or bladder dysfunction No GI distress No headaches   Objective: There were no vitals taken for this visit.  Physical Exam:  General: Alert and oriented. and No acute distress. Gait: Left sided antalgic gait.   Clinically, he has a varus alignment.  Mild tenderness palpation along the medial joint line.  No increased laxity to varus or valgus stress.  Negative Lachman.  Good range of motion.  IMAGING: I personally ordered and reviewed the following images:  No new imaging obtained today  [name], MD 09/01/2024 1:51 PM

## 2024-09-30 NOTE — Progress Notes (Signed)
 "   Impression/Assessment:  1.  History of elevated PSA with negative pathology and TURP, negative biopsies, stable PSA trend over last few years  2.  BPH, status post TURP 10 years ago with persistent excellent symptomatic response  Plan:  1.  His PSA is checked today  2.  I will have him come back in 1 year for routine check  HPI [name] [name] has a history of elevated PSA and had 2 negative prostate biopsies by [name] [name]. The last was in Sept 2010. His first biopsy was benign but the last had some inflammation. He has had a PCA3 that was negative.  PSA history: 05/2009 - 14.5 08/2009 - 18.4 5.11.2011 - 8.15 4.3.2012 - 6.67 10.4.2022: PSA 4.5  10.10.2023: 4.4 10.8.2024: 4.9  BPH:   Pt underwent TURP by [name] [name] in Dec 2014. 13 gms resected--all benign.  He has a good stream.  Rare initial hematuria.   Hematuria   8.31.2021: Pt reports experiencing gross hematuria and clots starting 2.24.2021. Pt notes that blood is present throughout is urine and clots pass approximately half-way through his stream. Pt reports no new aches or pains in his bones and torso. Pt reports that TURP was successful in alleviating his BPH symptoms. He does have a history of smoking but quit about 12 years ago. He also has a history of recurrent urolithiasis.   9.25.2021: CT A/P hematuria : IMPRESSION: 1. No nephroureterolithiasis. No hydronephrosis. No suspicious enhancing renal masses. No filling defects identified within the opacified renal collecting systems and ureters. 2. Enlarged heterogeneous prostate. 10.5.2021:   Cystoscopy was negative.  10.8.2024 follow-up he is here today for routine check.  He has been quite active with pickleball and is gardening.  He has not had any worrisome issues since his visit here last year.  He did have initial hematuria in March on 1 episode.  PSA 4.9  2.3.2026: Over the past year he has had no real urologic issues.  Still has a good stream.  Empties well.  No  recent UTI.  Past Medical History:  Diagnosis Date   Acid reflux disease    Arthritis    Chest pain, unspecified    Derangement of posterior horn of lateral meniscus    Derangement of posterior horn of medial meniscus    Diabetes mellitus without complication (HCC)    DJD (degenerative joint disease)    Elevated PSA    requiring several bx   GERD (gastroesophageal reflux disease)    Hyperlipidemia, mixed    Hypertension    PONV (postoperative nausea and vomiting)    Sleep apnea    Stop Bang score of 5, per pt he has been screened for sleep apnea, but they said he didn't have it   Tachycardia    Tear of anterior cruciate ligament of knee    bilateral    Past Surgical History:  Procedure Laterality Date   KNEE ARTHROSCOPY     Bilateral   NASAL SEPTUM SURGERY     PROSTATE BIOPSY  09/22/08    x 3   TONSILLECTOMY AND ADENOIDECTOMY     TRANSURETHRAL RESECTION OF PROSTATE N/A 08/11/2013   Procedure: TRANSURETHRAL RESECTION OF THE PROSTATE (TURP);  Surgeon: [name] I Javaid, MD;  Location: [facility];  Service: Urology;  Laterality: N/A;   VASECTOMY      Home Medications:  Allergies as of 10/06/2024   No Known Allergies      Medication List        Accurate  as of September 30, 2024  2:48 PM. If you have any questions, ask your nurse or doctor.          carvedilol  12.5 MG tablet Commonly known as: COREG  Take 12.5 mg by mouth 2 (two) times daily with a meal.   dapagliflozin  propanediol 5 MG Tabs tablet Commonly known as: FARXIGA  Take 5 mg by mouth daily.   esomeprazole  40 MG capsule Commonly known as: NEXIUM  Take 40 mg by mouth daily before breakfast.   icosapent Ethyl 1 g capsule Commonly known as: VASCEPA Take 2 g by mouth 2 (two) times daily.   magic mouthwash (lidocaine , diphenhydrAMINE, alum & mag hydroxide) suspension Swish and spit 5 mLs 4 (four) times daily as needed for mouth pain.   olmesartan-hydrochlorothiazide  40-25 MG tablet Commonly known as:  BENICAR HCT Take 1 tablet by mouth daily.   pravastatin 40 MG tablet Commonly known as: PRAVACHOL Take 40 mg by mouth daily.   traMADol  50 MG tablet Commonly known as: ULTRAM  Take 1 tablet (50 mg total) by mouth every 6 (six) hours as needed.   Trulicity 3 MG/0.5ML Soaj Generic drug: Dulaglutide Inject into the skin.        Allergies: No Known Allergies  No family history on file.  Social History:  reports that he has quit smoking. His smoking use included cigarettes. He has a 70 pack-year smoking history. His smokeless tobacco use includes chew. He reports that he does not currently use alcohol. He reports that he does not use drugs.  ROS: A complete review of systems was performed.  All systems are negative except for pertinent findings as noted.  Physical Exam:  Vital signs in last 24 hours: There were no vitals taken for this visit. Constitutional:  Alert and oriented, No acute distress Cardiovascular: Regular rate  Respiratory: Normal respiratory effort GU: Normal anal sphincter tone.  Prostate 80 g, symmetric, nonnodular, nontender. Lymphatic: No lymphadenopathy Neurologic: Grossly intact, no focal deficits Psychiatric: Normal mood and affect  I have reviewed prior pt notes  I have reviewed urinalysis results--clear  I have reviewed prior PSA and pathology results       "

## 2024-10-06 ENCOUNTER — Ambulatory Visit: Admitting: Urology

## 2024-10-06 VITALS — BP 148/82 | HR 71

## 2024-10-06 DIAGNOSIS — R310 Gross hematuria: Secondary | ICD-10-CM

## 2024-10-06 DIAGNOSIS — N400 Enlarged prostate without lower urinary tract symptoms: Secondary | ICD-10-CM | POA: Diagnosis not present

## 2024-10-06 DIAGNOSIS — R972 Elevated prostate specific antigen [PSA]: Secondary | ICD-10-CM

## 2024-10-06 DIAGNOSIS — N401 Enlarged prostate with lower urinary tract symptoms: Secondary | ICD-10-CM

## 2024-10-06 DIAGNOSIS — Z87438 Personal history of other diseases of male genital organs: Secondary | ICD-10-CM

## 2024-10-07 ENCOUNTER — Ambulatory Visit: Payer: Self-pay | Admitting: Urology

## 2024-10-07 ENCOUNTER — Other Ambulatory Visit: Payer: Self-pay | Admitting: Urology

## 2024-10-07 DIAGNOSIS — R972 Elevated prostate specific antigen [PSA]: Secondary | ICD-10-CM

## 2024-10-07 LAB — URINALYSIS, ROUTINE W REFLEX MICROSCOPIC
Bilirubin, UA: NEGATIVE
Glucose, UA: NEGATIVE
Ketones, UA: NEGATIVE
Leukocytes,UA: NEGATIVE
Nitrite, UA: NEGATIVE
Protein,UA: NEGATIVE
RBC, UA: NEGATIVE
Specific Gravity, UA: 1.01 (ref 1.005–1.030)
Urobilinogen, Ur: 0.2 mg/dL (ref 0.2–1.0)
pH, UA: 6 (ref 5.0–7.5)

## 2024-10-07 LAB — PSA: Prostate Specific Ag, Serum: 7.3 ng/mL — ABNORMAL HIGH (ref 0.0–4.0)

## 2024-10-07 NOTE — Telephone Encounter (Signed)
 Tried calling pt with no answer. LVM Your PSA is up a little bit compared to the past 3 to 4 years.  I will put an order in for you to drop in in 6 months to recheck.

## 2024-10-07 NOTE — Telephone Encounter (Signed)
-----   Message from Garnette Shack, MD sent at 10/07/2024 12:41 PM EST ----- I sent results by way of MyChart for his PSA.  I put an order in for PSA lab visit in 6 months.
